# Patient Record
Sex: Female | Born: 1975 | Race: White | Hispanic: No | Marital: Married | Smoking: Current every day smoker
Health system: Southern US, Community
[De-identification: ages and names within clinical notes are randomized; demographics above are authoritative.]

## PROBLEM LIST (undated history)

## (undated) DIAGNOSIS — F419 Anxiety disorder, unspecified: Secondary | ICD-10-CM

## (undated) DIAGNOSIS — J45909 Unspecified asthma, uncomplicated: Secondary | ICD-10-CM

## (undated) DIAGNOSIS — K2970 Gastritis, unspecified, without bleeding: Secondary | ICD-10-CM

## (undated) DIAGNOSIS — I2510 Atherosclerotic heart disease of native coronary artery without angina pectoris: Secondary | ICD-10-CM

## (undated) DIAGNOSIS — M549 Dorsalgia, unspecified: Secondary | ICD-10-CM

## (undated) HISTORY — PX: IUD REMOVAL: SHX5392

---

## 1998-11-21 DIAGNOSIS — O321XX0 Maternal care for breech presentation, not applicable or unspecified: Secondary | ICD-10-CM

## 2001-08-01 DIAGNOSIS — O365990 Maternal care for other known or suspected poor fetal growth, unspecified trimester, not applicable or unspecified: Secondary | ICD-10-CM

## 2004-09-04 ENCOUNTER — Ambulatory Visit (HOSPITAL_COMMUNITY): Admission: AD | Admit: 2004-09-04 | Discharge: 2004-09-04 | Payer: Self-pay | Admitting: *Deleted

## 2010-07-27 ENCOUNTER — Emergency Department (HOSPITAL_COMMUNITY)
Admission: EM | Admit: 2010-07-27 | Discharge: 2010-07-27 | Disposition: A | Discharge status: Home / Self Care | Payer: Self-pay | Attending: Emergency Medicine | Admitting: Emergency Medicine

## 2010-07-27 DIAGNOSIS — W1809XA Striking against other object with subsequent fall, initial encounter: Secondary | ICD-10-CM | POA: Insufficient documentation

## 2010-07-27 DIAGNOSIS — R1031 Right lower quadrant pain: Secondary | ICD-10-CM | POA: Insufficient documentation

## 2010-07-27 DIAGNOSIS — M545 Low back pain, unspecified: Secondary | ICD-10-CM | POA: Insufficient documentation

## 2010-07-27 DIAGNOSIS — M79609 Pain in unspecified limb: Secondary | ICD-10-CM | POA: Insufficient documentation

## 2010-07-27 DIAGNOSIS — S20229A Contusion of unspecified back wall of thorax, initial encounter: Secondary | ICD-10-CM | POA: Insufficient documentation

## 2010-07-27 DIAGNOSIS — R319 Hematuria, unspecified: Secondary | ICD-10-CM | POA: Insufficient documentation

## 2010-07-27 LAB — URINE MICROSCOPIC-ADD ON

## 2010-07-27 LAB — URINALYSIS, ROUTINE W REFLEX MICROSCOPIC
Bilirubin Urine: NEGATIVE
Protein, ur: NEGATIVE mg/dL
Specific Gravity, Urine: 1.025 (ref 1.005–1.030)
Urobilinogen, UA: 0.2 mg/dL (ref 0.0–1.0)

## 2012-05-26 ENCOUNTER — Emergency Department (HOSPITAL_COMMUNITY)
Admission: EM | Admit: 2012-05-26 | Discharge: 2012-05-27 | Disposition: A | Discharge status: Home / Self Care | Payer: Self-pay | Attending: Emergency Medicine | Admitting: Emergency Medicine

## 2012-05-26 ENCOUNTER — Encounter (HOSPITAL_COMMUNITY): Payer: Self-pay

## 2012-05-26 DIAGNOSIS — S61409A Unspecified open wound of unspecified hand, initial encounter: Secondary | ICD-10-CM | POA: Insufficient documentation

## 2012-05-26 DIAGNOSIS — F411 Generalized anxiety disorder: Secondary | ICD-10-CM | POA: Insufficient documentation

## 2012-05-26 DIAGNOSIS — S61411A Laceration without foreign body of right hand, initial encounter: Secondary | ICD-10-CM

## 2012-05-26 DIAGNOSIS — F17200 Nicotine dependence, unspecified, uncomplicated: Secondary | ICD-10-CM | POA: Insufficient documentation

## 2012-05-26 DIAGNOSIS — IMO0002 Reserved for concepts with insufficient information to code with codable children: Secondary | ICD-10-CM | POA: Insufficient documentation

## 2012-05-26 DIAGNOSIS — Z79899 Other long term (current) drug therapy: Secondary | ICD-10-CM | POA: Insufficient documentation

## 2012-05-26 DIAGNOSIS — Z8739 Personal history of other diseases of the musculoskeletal system and connective tissue: Secondary | ICD-10-CM | POA: Insufficient documentation

## 2012-05-26 DIAGNOSIS — S0091XA Abrasion of unspecified part of head, initial encounter: Secondary | ICD-10-CM

## 2012-05-26 DIAGNOSIS — Z23 Encounter for immunization: Secondary | ICD-10-CM | POA: Insufficient documentation

## 2012-05-26 HISTORY — DX: Anxiety disorder, unspecified: F41.9

## 2012-05-26 HISTORY — DX: Dorsalgia, unspecified: M54.9
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
## 2012-05-27 ENCOUNTER — Emergency Department (HOSPITAL_COMMUNITY): Payer: Self-pay

## 2012-05-27 ENCOUNTER — Encounter (HOSPITAL_COMMUNITY): Payer: Self-pay

## 2012-05-27 MED ORDER — LIDOCAINE-EPINEPHRINE (PF) 2 %-1:200000 IJ SOLN
INTRAMUSCULAR | Status: AC
  Administered 2012-05-27: 02:00:00
  Filled 2012-05-27: qty 20

## 2012-05-27 MED ORDER — HYDROCODONE-ACETAMINOPHEN 5-325 MG PO TABS
1.0000 | ORAL_TABLET | Freq: Once | ORAL | Status: AC
  Administered 2012-05-27: 1 via ORAL

## 2012-05-27 MED ORDER — HYDROCODONE-ACETAMINOPHEN 5-325 MG PO TABS
ORAL_TABLET | ORAL | Status: AC
  Filled 2012-05-27: qty 1

## 2012-05-27 MED ORDER — HYDROCODONE-ACETAMINOPHEN 5-325 MG PO TABS: 1.0000 | ORAL_TABLET | ORAL | Status: DC | PRN

## 2012-05-27 MED ORDER — TETANUS-DIPHTHERIA TOXOIDS TD 5-2 LFU IM INJ
0.5000 mL | INJECTION | Freq: Once | INTRAMUSCULAR | Status: AC
  Administered 2012-05-27: 0.5 mL via INTRAMUSCULAR
  Filled 2012-05-27: qty 0.5

## 2012-05-27 MED ORDER — OXYCODONE-ACETAMINOPHEN 5-325 MG PO TABS
2.0000 | ORAL_TABLET | Freq: Once | ORAL | Status: AC
  Administered 2012-05-27: 2 via ORAL
  Filled 2012-05-27: qty 2

## 2012-05-27 MED ORDER — SULFAMETHOXAZOLE-TMP DS 800-160 MG PO TABS
1.0000 | ORAL_TABLET | Freq: Once | ORAL | Status: AC
  Administered 2012-05-27: 1 via ORAL
  Filled 2012-05-27: qty 1

## 2012-05-27 MED ORDER — SULFAMETHOXAZOLE-TRIMETHOPRIM 800-160 MG PO TABS: 1.0000 | ORAL_TABLET | Freq: Two times a day (BID) | ORAL | Status: DC
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
## 2012-05-30 NOTE — ED Provider Notes (Signed)
Medical screening examination/treatment/procedure(s) were performed by non-physician practitioner and as supervising physician I was immediately available for consultation/collaboration.  Nesiah Jump de Lo, MD 05/30/12 0055 

## 2012-10-31 ENCOUNTER — Ambulatory Visit: Payer: Self-pay | Admitting: Family Medicine

## 2012-11-16 ENCOUNTER — Emergency Department: Payer: Self-pay | Admitting: Internal Medicine

## 2013-03-19 ENCOUNTER — Ambulatory Visit: Payer: Self-pay | Admitting: Gastroenterology

## 2013-03-22 LAB — PATHOLOGY REPORT

## 2013-09-06 ENCOUNTER — Ambulatory Visit
Admit: 2013-09-06 | Disposition: A | Discharge status: Home / Self Care | Payer: Self-pay | Attending: Internal Medicine | Admitting: Internal Medicine

## 2013-09-06 ENCOUNTER — Ambulatory Visit: Admit: 2013-09-06 | Disposition: A | Discharge status: Home / Self Care | Payer: Self-pay | Admitting: Internal Medicine

## 2013-11-05 ENCOUNTER — Emergency Department: Payer: Self-pay | Admitting: Emergency Medicine

## 2013-11-23 LAB — OB RESULTS CONSOLE GC/CHLAMYDIA
Chlamydia: NEGATIVE
Gonorrhea: NEGATIVE

## 2013-11-23 LAB — OB RESULTS CONSOLE ABO/RH: RH TYPE: POSITIVE

## 2013-11-23 LAB — OB RESULTS CONSOLE RUBELLA ANTIBODY, IGM: Rubella: NON-IMMUNE/NOT IMMUNE

## 2013-11-23 LAB — OB RESULTS CONSOLE HEPATITIS B SURFACE ANTIGEN: HEP B S AG: NEGATIVE

## 2013-11-23 LAB — OB RESULTS CONSOLE HIV ANTIBODY (ROUTINE TESTING): HIV: NONREACTIVE

## 2013-11-23 LAB — OB RESULTS CONSOLE ANTIBODY SCREEN: Antibody Screen: NEGATIVE

## 2013-11-23 LAB — OB RESULTS CONSOLE RPR: RPR: NONREACTIVE

## 2013-12-10 ENCOUNTER — Encounter: Payer: Self-pay | Admitting: Maternal & Fetal Medicine

## 2014-01-28 ENCOUNTER — Encounter: Payer: Self-pay | Admitting: Obstetrics and Gynecology

## 2014-02-07 ENCOUNTER — Encounter: Payer: Self-pay | Admitting: Maternal & Fetal Medicine

## 2014-02-07 ENCOUNTER — Observation Stay: Payer: Self-pay

## 2014-02-15 NOTE — L&D Delivery Note (Signed)
Delivery Note  39 y.o. [phone] female at 5877w3d by 1st trimester u/s, who was admitted on 02/17/14 as [facility] for preterm labor and need for level 3 NICU. She was noted to be 3 cm dilated with BBOW and kept in Trendelenberg position; was on various tocolytics and received betamethasone regimen. On 02/19/14, she had significant pelvic pressure, noted to be fully dilated and continued to contract despite tocolysis. PPROM occurred around 1208 on 02/19/14, and she was started on latency antibiotics.  Pain worsened and she received epidural during the day. She was stable until around 3:45 am on 02/20/14 when she started to feel pelvic pressure and had copious LOF, and was noted to fully dilated with fetus at 3+ position.  Neonatology team notified, and they arrived in room and patient started to push  At 4:07 AM a viable female was delivered via Spontaneous Vaginal Delivery/Vaginal Delivery after Cesarean Section (VBAC after three previous cesarean sections).  APGAR: 3, 5,7; weight 1 lb 9.8 oz (731 g).  Delivery performed by Dr. Fredirick LatheKristy Acosta under my supervision. Infant immediately handed over to Neonatology team.  Placenta noted to be fundal after delivery. Uterine massage administered, also injected solution of IV Pitocin 20 units + 20 ml of LR into umbilical cord.  Placenta delivered around 4:58 AM, intact. Cord: 3 vessels with no complications. Cord pH: pending at the time of this notation.  Anesthesia: Epidural  Lacerations: None  Est. Blood Loss (mL):  200 ml  Mom to postpartum.  Baby to NICU.  Jaynie CollinsUGONNA  Jamarria Real, MD, FACOG Attending Obstetrician & Gynecologist Faculty Practice, [facility] [place]

## 2014-02-16 ENCOUNTER — Observation Stay: Payer: Self-pay | Admitting: Obstetrics & Gynecology

## 2014-02-16 ENCOUNTER — Inpatient Hospital Stay (HOSPITAL_COMMUNITY)
Admission: AD | Admit: 2014-02-16 | Discharge: 2014-02-22 | DRG: 775 | Disposition: A | Discharge status: Home / Self Care | Payer: BLUE CROSS/BLUE SHIELD | Source: Ambulatory Visit | Attending: Obstetrics & Gynecology | Admitting: Obstetrics & Gynecology

## 2014-02-16 DIAGNOSIS — Z91013 Allergy to seafood: Secondary | ICD-10-CM | POA: Diagnosis not present

## 2014-02-16 DIAGNOSIS — Z2839 Other underimmunization status: Secondary | ICD-10-CM

## 2014-02-16 DIAGNOSIS — Z720 Tobacco use: Secondary | ICD-10-CM | POA: Diagnosis not present

## 2014-02-16 DIAGNOSIS — Z882 Allergy status to sulfonamides status: Secondary | ICD-10-CM | POA: Diagnosis not present

## 2014-02-16 DIAGNOSIS — O3421 Maternal care for scar from previous cesarean delivery: Secondary | ICD-10-CM | POA: Diagnosis present

## 2014-02-16 DIAGNOSIS — F419 Anxiety disorder, unspecified: Secondary | ICD-10-CM | POA: Diagnosis present

## 2014-02-16 DIAGNOSIS — Z880 Allergy status to penicillin: Secondary | ICD-10-CM

## 2014-02-16 DIAGNOSIS — Z9889 Other specified postprocedural states: Secondary | ICD-10-CM

## 2014-02-16 DIAGNOSIS — O34219 Maternal care for unspecified type scar from previous cesarean delivery: Secondary | ICD-10-CM

## 2014-02-16 DIAGNOSIS — O99824 Streptococcus B carrier state complicating childbirth: Secondary | ICD-10-CM | POA: Diagnosis present

## 2014-02-16 DIAGNOSIS — Z283 Underimmunization status: Secondary | ICD-10-CM

## 2014-02-16 DIAGNOSIS — O9989 Other specified diseases and conditions complicating pregnancy, childbirth and the puerperium: Secondary | ICD-10-CM

## 2014-02-16 DIAGNOSIS — O26872 Cervical shortening, second trimester: Secondary | ICD-10-CM | POA: Diagnosis not present

## 2014-02-16 DIAGNOSIS — Z91012 Allergy to eggs: Secondary | ICD-10-CM

## 2014-02-16 DIAGNOSIS — Z3A24 24 weeks gestation of pregnancy: Secondary | ICD-10-CM | POA: Diagnosis present

## 2014-02-16 DIAGNOSIS — F17200 Nicotine dependence, unspecified, uncomplicated: Secondary | ICD-10-CM

## 2014-02-16 DIAGNOSIS — O09529 Supervision of elderly multigravida, unspecified trimester: Secondary | ICD-10-CM

## 2014-02-16 DIAGNOSIS — F1120 Opioid dependence, uncomplicated: Secondary | ICD-10-CM | POA: Diagnosis not present

## 2014-02-16 DIAGNOSIS — Z789 Other specified health status: Secondary | ICD-10-CM | POA: Diagnosis not present

## 2014-02-16 DIAGNOSIS — G8929 Other chronic pain: Secondary | ICD-10-CM | POA: Diagnosis present

## 2014-02-16 DIAGNOSIS — O09522 Supervision of elderly multigravida, second trimester: Secondary | ICD-10-CM

## 2014-02-16 DIAGNOSIS — O09899 Supervision of other high risk pregnancies, unspecified trimester: Secondary | ICD-10-CM

## 2014-02-16 DIAGNOSIS — O99323 Drug use complicating pregnancy, third trimester: Secondary | ICD-10-CM | POA: Diagnosis not present

## 2014-02-16 DIAGNOSIS — Z8619 Personal history of other infectious and parasitic diseases: Secondary | ICD-10-CM

## 2014-02-16 DIAGNOSIS — O26879 Cervical shortening, unspecified trimester: Secondary | ICD-10-CM | POA: Insufficient documentation

## 2014-02-16 HISTORY — DX: Gastritis, unspecified, without bleeding: K29.70

## 2014-02-16 LAB — DRUG SCREEN, URINE
Amphetamines, Ur Screen: NEGATIVE (ref ?–1000)
Barbiturates, Ur Screen: NEGATIVE (ref ?–200)
Benzodiazepine, Ur Scrn: NEGATIVE (ref ?–200)
CANNABINOID 50 NG, UR ~~LOC~~: NEGATIVE (ref ?–50)
COCAINE METABOLITE, UR ~~LOC~~: NEGATIVE (ref ?–300)
MDMA (Ecstasy)Ur Screen: NEGATIVE (ref ?–500)
Methadone, Ur Screen: POSITIVE (ref ?–300)
Opiate, Ur Screen: POSITIVE (ref ?–300)
PHENCYCLIDINE (PCP) UR S: NEGATIVE (ref ?–25)
Tricyclic, Ur Screen: NEGATIVE (ref ?–1000)

## 2014-02-16 LAB — URINALYSIS, COMPLETE
BACTERIA: NONE SEEN
BACTERIA: NONE SEEN
BILIRUBIN, UR: NEGATIVE
Bilirubin,UR: NEGATIVE
GLUCOSE, UR: NEGATIVE mg/dL (ref 0–75)
Glucose,UR: NEGATIVE mg/dL (ref 0–75)
KETONE: NEGATIVE
KETONE: NEGATIVE
LEUKOCYTE ESTERASE: NEGATIVE
Leukocyte Esterase: NEGATIVE
Nitrite: NEGATIVE
Nitrite: NEGATIVE
Ph: 6 (ref 4.5–8.0)
Ph: 5 (ref 4.5–8.0)
Protein: NEGATIVE
Protein: NEGATIVE
Specific Gravity: 1.002 (ref 1.003–1.030)
Specific Gravity: 1.012 (ref 1.003–1.030)
Squamous Epithelial: 2
WBC UR: NONE SEEN /HPF (ref 0–5)

## 2014-02-16 LAB — OB RESULTS CONSOLE GBS: GBS: NEGATIVE

## 2014-02-17 ENCOUNTER — Encounter (HOSPITAL_COMMUNITY): Payer: Self-pay | Admitting: *Deleted

## 2014-02-17 ENCOUNTER — Inpatient Hospital Stay (HOSPITAL_COMMUNITY): Payer: BLUE CROSS/BLUE SHIELD

## 2014-02-17 DIAGNOSIS — Z283 Underimmunization status: Secondary | ICD-10-CM

## 2014-02-17 DIAGNOSIS — Z3A24 24 weeks gestation of pregnancy: Secondary | ICD-10-CM | POA: Insufficient documentation

## 2014-02-17 DIAGNOSIS — O09529 Supervision of elderly multigravida, unspecified trimester: Secondary | ICD-10-CM

## 2014-02-17 DIAGNOSIS — Z8619 Personal history of other infectious and parasitic diseases: Secondary | ICD-10-CM

## 2014-02-17 DIAGNOSIS — O26879 Cervical shortening, unspecified trimester: Secondary | ICD-10-CM

## 2014-02-17 DIAGNOSIS — Z9889 Other specified postprocedural states: Secondary | ICD-10-CM

## 2014-02-17 DIAGNOSIS — O34219 Maternal care for unspecified type scar from previous cesarean delivery: Secondary | ICD-10-CM

## 2014-02-17 DIAGNOSIS — Z2839 Other underimmunization status: Secondary | ICD-10-CM

## 2014-02-17 DIAGNOSIS — O9989 Other specified diseases and conditions complicating pregnancy, childbirth and the puerperium: Secondary | ICD-10-CM

## 2014-02-17 DIAGNOSIS — F17200 Nicotine dependence, unspecified, uncomplicated: Secondary | ICD-10-CM

## 2014-02-17 DIAGNOSIS — F1120 Opioid dependence, uncomplicated: Secondary | ICD-10-CM

## 2014-02-17 LAB — CBC
HCT: 28.6 % — ABNORMAL LOW (ref 36.0–46.0)
Hemoglobin: 9.9 g/dL — ABNORMAL LOW (ref 12.0–15.0)
MCH: 31.5 pg (ref 26.0–34.0)
MCHC: 34.6 g/dL (ref 30.0–36.0)
MCV: 91.1 fL (ref 78.0–100.0)
Platelets: 292 10*3/uL (ref 150–400)
RBC: 3.14 MIL/uL — ABNORMAL LOW (ref 3.87–5.11)
RDW: 12.5 % (ref 11.5–15.5)
WBC: 24.3 10*3/uL — ABNORMAL HIGH (ref 4.0–10.5)

## 2014-02-17 LAB — HIV ANTIBODY (ROUTINE TESTING W REFLEX): HIV: NONREACTIVE

## 2014-02-17 LAB — AMNISURE RUPTURE OF MEMBRANE (ROM) NOT AT ARMC: Amnisure ROM: NEGATIVE

## 2014-02-17 LAB — GC/CHLAMYDIA PROBE AMP

## 2014-02-17 LAB — TYPE AND SCREEN
ABO/RH(D): O POS
Antibody Screen: NEGATIVE

## 2014-02-17 LAB — ABO/RH: ABO/RH(D): O POS

## 2014-02-17 LAB — RPR

## 2014-02-17 MED ORDER — DOCUSATE SODIUM 100 MG PO CAPS
100.0000 mg | ORAL_CAPSULE | Freq: Two times a day (BID) | ORAL | Status: DC
  Administered 2014-02-17 – 2014-02-19 (×4): 100 mg via ORAL
  Filled 2014-02-17 (×5): qty 1

## 2014-02-17 MED ORDER — METHADONE HCL 10 MG PO TABS
10.0000 mg | ORAL_TABLET | Freq: Three times a day (TID) | ORAL | Status: DC
Start: 2014-02-17 — End: 2014-02-22
  Administered 2014-02-17 – 2014-02-22 (×16): 10 mg via ORAL
  Filled 2014-02-17 (×16): qty 1

## 2014-02-17 MED ORDER — PRENATAL MULTIVITAMIN CH
1.0000 | ORAL_TABLET | Freq: Every day | ORAL | Status: DC
  Administered 2014-02-17: 1 via ORAL
  Filled 2014-02-17: qty 1

## 2014-02-17 MED ORDER — MAGNESIUM SULFATE 40 G IN LACTATED RINGERS - SIMPLE
2.5000 g/h | INTRAVENOUS | Status: DC
  Administered 2014-02-17 – 2014-02-18 (×3): 2.5 g/h via INTRAVENOUS
  Filled 2014-02-17 (×3): qty 500

## 2014-02-17 MED ORDER — METHADONE HCL 10 MG PO TABS
20.0000 mg | ORAL_TABLET | Freq: Every day | ORAL | Status: DC
Start: 2014-02-17 — End: 2014-02-22
  Administered 2014-02-17 – 2014-02-21 (×5): 20 mg via ORAL
  Filled 2014-02-17 (×5): qty 2

## 2014-02-17 MED ORDER — ACYCLOVIR 200 MG PO CAPS
400.0000 mg | ORAL_CAPSULE | Freq: Three times a day (TID) | ORAL | Status: DC
  Administered 2014-02-17 (×3): 400 mg via ORAL
  Filled 2014-02-17 (×7): qty 2

## 2014-02-17 MED ORDER — CALCIUM CARBONATE ANTACID 500 MG PO CHEW: 2.0000 | CHEWABLE_TABLET | ORAL | Status: DC | PRN

## 2014-02-17 MED ORDER — ACETAMINOPHEN 325 MG PO TABS: 650.0000 mg | ORAL_TABLET | ORAL | Status: DC | PRN

## 2014-02-17 MED ORDER — VANCOMYCIN HCL IN DEXTROSE 1-5 GM/200ML-% IV SOLN
1000.0000 mg | Freq: Two times a day (BID) | INTRAVENOUS | Status: DC
  Administered 2014-02-17 – 2014-02-18 (×3): 1000 mg via INTRAVENOUS
  Filled 2014-02-17 (×4): qty 200

## 2014-02-17 MED ORDER — BETAMETHASONE SOD PHOS & ACET 6 (3-3) MG/ML IJ SUSP
6.0000 mg | Freq: Once | INTRAMUSCULAR | Status: AC
  Administered 2014-02-17: 6 mg via INTRAMUSCULAR
  Filled 2014-02-17: qty 1

## 2014-02-17 MED ORDER — ZOLPIDEM TARTRATE 5 MG PO TABS: 5.0000 mg | ORAL_TABLET | Freq: Every evening | ORAL | Status: DC | PRN

## 2014-02-17 MED ORDER — ZOLPIDEM TARTRATE 5 MG PO TABS
5.0000 mg | ORAL_TABLET | Freq: Every evening | ORAL | Status: DC | PRN
  Administered 2014-02-17 – 2014-02-19 (×2): 5 mg via ORAL
  Filled 2014-02-17 (×2): qty 1

## 2014-02-17 MED ORDER — LACTATED RINGERS IV SOLN
INTRAVENOUS | Status: DC
  Administered 2014-02-16 – 2014-02-19 (×7): via INTRAVENOUS

## 2014-02-17 MED ORDER — DOCUSATE SODIUM 100 MG PO CAPS: 100.0000 mg | ORAL_CAPSULE | Freq: Every day | ORAL | Status: DC

## 2014-02-17 MED ORDER — BETAMETHASONE SOD PHOS & ACET 6 (3-3) MG/ML IJ SUSP
12.0000 mg | Freq: Once | INTRAMUSCULAR | Status: AC
  Administered 2014-02-17: 12 mg via INTRAMUSCULAR
  Filled 2014-02-17: qty 2

## 2014-02-17 MED ORDER — PROGESTERONE MICRONIZED 200 MG PO CAPS
200.0000 mg | ORAL_CAPSULE | Freq: Every day | ORAL | Status: DC
  Administered 2014-02-17 – 2014-02-18 (×3): 200 mg via VAGINAL
  Filled 2014-02-17 (×4): qty 1

## 2014-02-17 NOTE — Plan of Care (Signed)
Problem: Consults Goal: Birthing Suites Patient Information Press F2 to bring up selections list Outcome: Completed/Met Date Met:  02/17/14  Pt < [redacted] weeks EGA

## 2014-02-17 NOTE — H&P (Signed)
Stacey Hoffman is a 39 y.o. [phone] female at [redacted]w[redacted]d by 1st trimester u/s,  presenting as transfer from [place] for preterm labor and need for level 3 NICU. Known short cx of 1.9cm w/ funneling since anatomy scan, was placed on nightly prometrium. Reported uc's/pressure today, was 3cm w/ membranes visible but not protruding through os.  GBS, GC/CT, UA, UDS were collected. UDS + for opiates and methadone. UA neg. Was given mag 4gm loading dose, then 1gm/hr which was subsequently increased to 2 then to 2.5 d/t uc's. BMZ  IM @ 2030. Vanc 1gm IV @ 2200 for gbs prophylaxis and PCN allergy. Indocin  loading dose @ 2205. Had informal bs u/s which revealed vtx presentation, normal fluid, fundal placenta.  Reports active fetal movement, contractions: regular- much better than they were earlier today, vaginal bleeding: none, membranes: intact. Prenatal care at [facility] in [place].   Pregnancy complicated by prior c/s x3, chronic opioid use d/t chronic hip pain- goes to [facility] in [place] and sees Dr. Osborne Oman- current regimen is Oxycodone IR  TID and was recently started on methadone on 12/3 and regimen is  TID and  q hs. 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 Smoker 1.5ppd, s/p LEEP 1998, short cervix, rub non-imm, AMA w/ neg NIPT XY  1999: 21wk vaginal PTB twins d/t placental abruption 2000: Term c/s 2000 d/t breech 2003: 36wk c/s for IUGR  2006: term c/s   Past Medical History: Past Medical History  Diagnosis Date  . Anxiety   . Back pain   . Gastritis     Past Surgical History: Past Surgical History  Procedure Laterality Date  . Cesarean section    . Iud removal N/A     IUD imbeded into uterus    Obstetrical History: OB History    Gravida Para Term Preterm AB TAB SAB Ectopic Multiple Living   0 0 0 0 1 3      Social History: History   Social History  . Marital Status: Married    Spouse Name: N/A    Number of Children: N/A  .  Years of Education: N/A   Social History Main Topics  . Smoking status: Current Every Day Smoker -- 1.50 packs/day for 15 years    Types: Cigarettes  . Smokeless tobacco: None  . Alcohol Use: No  . Drug Use: Yes    Special: Oxycodone, Other-see comments  . Sexual Activity: None   Other Topics Concern  . None   Social History Narrative    Family History: History reviewed. No pertinent family history.  Allergies: Allergies  Allergen Reactions  . Eggs Or Egg-Derived Products Hives  . Penicillins   . Shellfish Allergy Hives  . Sulfa Antibiotics Swelling    Prescriptions prior to admission  Medication Sig Dispense Refill Last Dose  . diazepam (VALIUM) 5 MG tablet Take 5 mg by mouth every 6 (six) hours as needed for anxiety.     Marland Kitchen HYDROcodone-acetaminophen (NORCO) 10-325 MG per tablet Take 1 tablet by mouth every 6 (six) hours as needed for pain.     Marland Kitchen HYDROcodone-acetaminophen (NORCO/VICODIN) 5-325 MG per tablet Take 1 tablet by mouth every 4 (four) hours as needed for pain. 15 tablet 0   . phentermine (ADIPEX-P) 37.5 MG tablet Take 37.5 mg by mouth daily before breakfast.     . sulfamethoxazole-trimethoprim (SEPTRA DS) 800-160 MG per tablet Take 1 tablet by mouth every 12 (twelve) hours.  20 tablet 0      Review of Systems  Pertinent pos/neg as indicated in HPI    Blood pressure 106/57, pulse 70, temperature 97.3 F (36.3 C), temperature source Oral, resp. rate 18, height  (1.499 m), weight 66.679 kg (147 lb). General appearance: alert, cooperative and no distress Lungs: clear to auscultation bilaterally Heart: regular rate and rhythm Abdomen: gravid, soft, non-tender Extremities: no edema DTR's 2+  Fetal monitoring: FHR: 135 bpm, variability: minimal ,  Accelerations: Abscent,  decelerations:  Absent Uterine activity: none tracing, pt reporting uc's spacing out and much better than earlier today    Presentation: cephalic by informal bs u/s at  [facility]   Prenatal labs: ABO, Rh: O/Positive/-- (10/09 0000) Antibody: Negative (10/09 0000) Rubella:  non-imm RPR: Nonreactive (10/09 0000)  HBsAg: Negative (10/09 0000)  HIV: Non-reactive (10/09 0000)  GBS:   pending  1 hr Glucola: not done yet Genetic screening:  NIPT neg, XY Anatomy US: normal fetus w/ CL 1.9 w/ funneling  No results found for this or any previous visit (from the past 24 hour(s)).   Assessment:  [redacted]w[redacted]d SIUP  [id number]  PTL  H/O c/s x 3  Opioid dependence  Short cervix  H/O HSV no active lesions  Cat 2 FHR, minimal variability, 24wks on mag  GBS  pending  Smoker  AMA  Rub non-imm  H/O LEEP  Plan:  Discussed all w/ Dr. 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 Jolayne Panther- recommended having pharmacy confirm methadone regimen- and not to order methadone and oxycodone together.   Spoke w/ Ann, pharmacist- who states she can not confirm methadone tonight, may be able to in am. She recommends ordering sustained release oxycodone until we are able to confirm meds/dosages.     Marge Duncans CNM, [facility] 02/17/2014, 1:04 AM    0245: pt's fob brought in rx bottles from car (1) methadone  TID,  q hs (2) oxycodone hcl  TID prn pain Discussed w/ Ann in pharmacy, who states ok to order both as rx'd, but per previous discussion w/ Dr. Jolayne Panther she does not want her on both here- so will just order methadone as rx'd.  Cheral Marker, CNM, [facility] 02/17/2014 2:47 AM

## 2014-02-17 NOTE — Progress Notes (Addendum)
Patient ID: Stacey Hoffman, female   DOB: Jun 24, 1975, 39 y.o.   MRN: [id number] FACULTY PRACTICE ANTEPARTUM(COMPREHENSIVE) NOTE  Stacey Hoffman is a 39 y.o. [id number] at [redacted]w[redacted]d who is admitted for Preterm labor.   Fetal presentation is cephalic. Length of Stay:  1  Days  Subjective: Doing well, uc's have stopped, feeling a lot of pressure like she needs to have a bm Patient reports the fetal movement as active. Patient reports uterine contraction  activity as none. Patient reports  vaginal bleeding as none. Patient describes fluid per vagina as None.  Vitals:  Blood pressure 88/45, pulse 75, temperature 97.6 F (36.4 C), temperature source Oral, resp. rate 18, height  (1.499 m), weight 66.679 kg (147 lb), last menstrual period 07/29/2013. Physical Examination:  General appearance - alert, well appearing, and in no distress Heart - normal rate and regular rhythm Abdomen - soft, nontender, nondistended Fundal Height:  size equals dates Cervical Exam: 3/80/ballotable, membranes intact  Extremities: extremities normal, atraumatic, no cyanosis or edema and Homans sign is negative, no sign of DVT with DTRs 2+ bilaterally Membranes:intact  Fetal Monitoring:  Baseline: 140 bpm, Variability: min-mod, Accelerations: 10x10 and Decelerations: rare variable  Labs:  Results for orders placed or performed during the hospital encounter of 02/16/14 (from the past 24 hour(s))  CBC on admission   Collection Time: 02/17/14  1:20 AM  Result Value Ref Range   WBC 24.3 (H) 4.0 - 10.5 K/uL   RBC 3.14 (L) 3.87 - 5.11 MIL/uL   Hemoglobin 9.9 (L) 12.0 - 15.0 g/dL   HCT 14.7 (L) 82.9 - 56.2 %   MCV 91.1 78.0 - 100.0 fL   MCH 31.5 26.0 - 34.0 pg   MCHC 34.6 30.0 - 36.0 g/dL   RDW 13.0 86.5 - 78.4 %   Platelets 292 150 - 400 K/uL    Imaging Studies:    Limited 02/17/14: vtx, afv normal, cervical funneling  Medications:  Scheduled . acyclovir  400 mg Oral TID  . betamethasone  acetate-betamethasone sodium phosphate  12 mg Intramuscular Once  . docusate sodium  100 mg Oral BID  . methadone  10 mg Oral TID  . methadone  20 mg Oral QHS  . prenatal multivitamin  1 tablet Oral Q1200  . progesterone  200 mg Vaginal QHS  . vancomycin  1,000 mg Intravenous Q12H   I have reviewed the patient's current medications.  ASSESSMENT: Patient Active Problem List   Diagnosis Date Noted  . Preterm labor 02/17/2014  . Previous cesarean delivery x 3, antepartum 02/17/2014  . Opioid dependence 02/17/2014  . History of herpes genitalis 02/17/2014  . Smoker 02/17/2014  . Short cervix affecting pregnancy 02/17/2014  . Rubella non-immune status, antepartum 02/17/2014  . AMA (advanced maternal age) multigravida 35+ 02/17/2014  . H/O LEEP 02/17/2014    PLAN: Continue mag until 2nd BMZ tonight @ 2030 Continue vanc until gbs returns Continue prometrium Methadone  TID and  q hs Acyclovir for HSV suppression NICU consult Prev c/s x 3   Namish Krise, Cheron Every 02/17/2014,7:45 AM

## 2014-02-18 ENCOUNTER — Inpatient Hospital Stay (HOSPITAL_COMMUNITY): Payer: BLUE CROSS/BLUE SHIELD

## 2014-02-18 ENCOUNTER — Encounter (HOSPITAL_COMMUNITY): Payer: Self-pay | Admitting: *Deleted

## 2014-02-18 DIAGNOSIS — O26879 Cervical shortening, unspecified trimester: Secondary | ICD-10-CM | POA: Insufficient documentation

## 2014-02-18 LAB — GROUP B STREP BY PCR: Group B strep by PCR: NEGATIVE

## 2014-02-18 LAB — AMNISURE RUPTURE OF MEMBRANE (ROM) NOT AT ARMC: Amnisure ROM: NEGATIVE

## 2014-02-18 MED ORDER — ACYCLOVIR 400 MG PO TABS
400.0000 mg | ORAL_TABLET | Freq: Three times a day (TID) | ORAL | Status: DC
  Administered 2014-02-18 – 2014-02-19 (×4): 400 mg via ORAL
  Filled 2014-02-18 (×6): qty 1

## 2014-02-18 MED ORDER — NIFEDIPINE ER 30 MG PO TB24
30.0000 mg | ORAL_TABLET | Freq: Two times a day (BID) | ORAL | Status: DC
Start: 2014-02-18 — End: 2014-02-19
  Administered 2014-02-18 (×2): 30 mg via ORAL
  Filled 2014-02-18 (×4): qty 1

## 2014-02-18 MED ORDER — BETAMETHASONE SOD PHOS & ACET 6 (3-3) MG/ML IJ SUSP
12.0000 mg | Freq: Once | INTRAMUSCULAR | Status: DC
  Filled 2014-02-18: qty 2

## 2014-02-18 MED ORDER — NIFEDIPINE 10 MG PO CAPS
10.0000 mg | ORAL_CAPSULE | ORAL | Status: DC | PRN
  Administered 2014-02-18 – 2014-02-19 (×2): 10 mg via ORAL
  Filled 2014-02-18 (×2): qty 1

## 2014-02-18 NOTE — Progress Notes (Signed)
Pt called to report that she thought her water broke. Towel wet but smells of urine, foley cath in place. Dr Adrian Blackwater notified, plan for [facility]. Test performed and sent to lab at 2145. Pt resting comfortably in bed but visibly upset about possible SROM.

## 2014-02-18 NOTE — Progress Notes (Signed)
I spent time with Stacey Hoffman and her friend this afternoon.  They both have a strong faith and are holding out hope that Stacey Hoffman's pregnancy will continue for awhile.  They are counting every hour and day that she remains pregnant as a blessing.  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  Stacey Hoffman has significant worry about leaving so much to her husband to cope with alone, but even during our conversation, she received a phone call from her father-in-law that he would help take care of the children.    We will continue to follow up for support, but please also page as needs arise.  [employer] Pager, [phone] 4:30 PM    02/18/14 1600  Clinical Encounter Type  Visited With Patient  Visit Type Spiritual support  Referral From Nurse  Spiritual Encounters  Spiritual Needs Emotional  Stress Factors  Patient Stress Factors Loss of control

## 2014-02-18 NOTE — Progress Notes (Signed)
Pt told she was now NPO. Became very angry. States no MD has seen her since she has been here. Pt states " I have only seen people like you". States no one will help her wants her bed rail down to reach her stuff. Offered to put it down pt slamming stuff around and states now there is no need since she can't have anything. Advised pt I would be willing to help her all she has to do is ask. Pt now refusing to respond to anything I say.

## 2014-02-18 NOTE — Progress Notes (Signed)
Patient ID: Stacey Hoffman, female   DOB: [birth year], 39 y.o.   MRN: [id number]  Called to patient room - having contraction about every 3 minutes.  Patient was transitioned to Procardia XL  at 3pm.  Will give Procardia IR  q4 hr prn.  If this is not helpful at eliminating contractions, will restart magnesium sulfate.   Also, consented pt for repeat cesarean section, should patient need it emergently - The risks of cesarean section discussed with the patient included but were not limited to: bleeding which may require transfusion or reoperation; infection which may require antibiotics; injury to bowel, bladder, ureters or other surrounding organs; injury to the fetus;  incisional problems, thromboembolic phenomenon and other postoperative/anesthesia complications. The patient concurred with the proposed plan, giving informed written consent for the procedure.     Candelaria Celeste JEHIEL 02/18/2014 8:49 PM

## 2014-02-18 NOTE — Progress Notes (Signed)
Pr req I take 1200 VS now and would like to rest uninterrupted for the next several hours. Instructed pt to call if she needs me. Pt voices understanding.

## 2014-02-18 NOTE — Progress Notes (Signed)
S: Patient asking to speak to MD about confirmation of plan since receiving [place]. Spoke to Dr. Jolayne Panther and reviewed [place] report of "open cervix." Given improving frequency of contractions on magnesium, feel stable to wean off magnesium and initiate procardia 30 mg XL BID.   Pt does report back pain (chronic) and intermittent pressure/burning in pelvis that causes her to want to "squeeze her legs."  O: Deferred any physical exam.  [place]: Cervic appears open, infant cephalic  A/P: PTL with advance cervical dilation.  1) Wean magnesium and initiate procardia 30 mg XL BID 2) Obtain GBS records from [facility]. If neg, can d/c vanc. Also obtain GBS, although less accurate in setting of tx with Vanc. 3) Obtain US for EFW per NICU request.   Plan discussed with Dr. Jolayne Panther, OB attending.   Addendum -  GBS report from [facility]  - NG at 36 hours, will d/c Vanc.

## 2014-02-18 NOTE — Progress Notes (Signed)
Patient ID: Stacey Hoffman, female   DOB: 06-27-1975, 39 y.o.   MRN: [id number] ACULTY PRACTICE ANTEPARTUM COMPREHENSIVE PROGRESS NOTE  Stacey Hoffman is a 39 y.o. [id number] at [redacted]w[redacted]d  who is admitted for Preterm labor.   Fetal presentation is cephalic. Length of Stay:  2  Days Past Surgical History  Procedure Laterality Date  . Cesarean section    . Iud removal N/A     IUD imbeded into uterus    Subjective: Pt reports increased pressure in the vagina.  She also reports some leakage but, she is not sure whether it is urine.  The amnisure on yesterday was negative.  Patient reports good fetal movement.  She reports no uterine contractions and no bleeding.  Vitals:  Blood pressure 96/57, pulse 77, temperature 98.3 F (36.8 C), temperature source Oral, resp. rate 16, height  (1.499 m), weight 147 lb (66.679 kg), last menstrual period 07/29/2013, SpO2 99 %. Physical Examination: General appearance - alert, well appearing, and in no distress Abdomen - soft, nontender, nondistended, no masses or organomegaly gravid Pelvic - there appears to be a large bulging BOW.  There appears to be cervix around it but, due to concern of rupturing the membranes I aborted the exam.  Extremities - no pedal edema noted Extremities: extremities normal, atraumatic, no cyanosis or edema  Membranes:intact  Fetal Monitoring:  Baseline: 140's bpm, Variability: Fair (1-6 bpm), Accelerations: Non-reactive but appropriate for gestational age and Decelerations: Variable: mild Toco: no ctx Labs:  Results for orders placed or performed during the hospital encounter of 02/16/14 (from the past 24 hour(s))  Amnisure rupture of membrane (rom)   Collection Time: 02/17/14  4:15 PM  Result Value Ref Range   Amnisure ROM NEGATIVE     Imaging Studies:    See below  Medications:  Scheduled . acyclovir  400 mg Oral TID  . docusate sodium  100 mg Oral BID  . methadone  10 mg Oral TID  . methadone  20 mg Oral  QHS  . prenatal multivitamin  1 tablet Oral Q1200  . progesterone  200 mg Vaginal QHS  . vancomycin  1,000 mg Intravenous Q12H   I have reviewed the patient's current medications.  ASSESSMENT: Patient Active Problem List   Diagnosis Date Noted  . Preterm labor 02/17/2014  . Previous cesarean delivery x 3, antepartum 02/17/2014  . Opioid dependence 02/17/2014  . History of herpes genitalis 02/17/2014  . Smoker 02/17/2014  . Short cervix affecting pregnancy 02/17/2014  . Rubella non-immune status, antepartum 02/17/2014  . AMA (advanced maternal age) multigravida 35+ 02/17/2014  . H/O LEEP 02/17/2014  . [redacted] weeks gestation of pregnancy     PLAN: sono today to check cervical dilation and presnetation Keep Magnesium and vanc for now Continue routine antenatal care.   HARRAWAY-SMITH, Shacoya Burkhammer 02/18/2014,7:43 AM

## 2014-02-18 NOTE — Consult Note (Addendum)
Asked by Dr.Harraway-Smith to provide prenatal consultation for patient at risk for preterm delivery due to preterm labor.  Mother is 39 y.o. G5 P2-2-0-3 who is now 24 1/[redacted] weeks EGA, with pregnancy complicated by advanced cervical dilatation with bulging membranes.  She has been treated with betamethasone (2nd dose about 2030 on 10/3) and is getting MgSO4 for tocoloysis and fetal neuroprotection, and vancomycin (GBS pending).  Discussed usual expectations for preterm infant at 48 - [redacted] weeks gestation, including possible needs for DR resuscitation, respiratory support, IV access, and blood products.  Also presented risks of death or serious morbidity, including neurodevelopmental disability.  Projected possible length of stay in NICU until 36 - [redacted] wks EGA.  Discussed advantages of feeding with mother's milk.  She plans to pump postnatally.  Patient was attentive, had appropriate questions, and was appreciative of my input.  Thank you for the consultation.  Total time 25 minutes  Stacey Hoffman., MD

## 2014-02-18 NOTE — Progress Notes (Signed)
Attempted to notify Dr. Jolayne Panther of [place] result. No answer on attending phone. Dr. Loreta Ave notified.

## 2014-02-19 ENCOUNTER — Inpatient Hospital Stay (HOSPITAL_COMMUNITY): Payer: BLUE CROSS/BLUE SHIELD | Admitting: Anesthesiology

## 2014-02-19 DIAGNOSIS — F1120 Opioid dependence, uncomplicated: Secondary | ICD-10-CM

## 2014-02-19 DIAGNOSIS — O09522 Supervision of elderly multigravida, second trimester: Secondary | ICD-10-CM

## 2014-02-19 LAB — AMNISURE RUPTURE OF MEMBRANE (ROM) NOT AT ARMC: Amnisure ROM: NEGATIVE

## 2014-02-19 LAB — BETA STREP CULTURE(ARMC)

## 2014-02-19 MED ORDER — LACTATED RINGERS IV SOLN: 500.0000 mL | Freq: Once | INTRAVENOUS | Status: DC

## 2014-02-19 MED ORDER — GENTAMICIN IN SALINE 1-0.9 MG/ML-% IV SOLN
100.0000 mg | Freq: Two times a day (BID) | INTRAVENOUS | Status: DC
  Filled 2014-02-19: qty 100

## 2014-02-19 MED ORDER — EPHEDRINE 5 MG/ML INJ
10.0000 mg | INTRAVENOUS | Status: DC | PRN
  Filled 2014-02-19: qty 2

## 2014-02-19 MED ORDER — FENTANYL 2.5 MCG/ML BUPIVACAINE 1/10 % EPIDURAL INFUSION (WH - ANES)
14.0000 mL/h | INTRAMUSCULAR | Status: DC | PRN
  Administered 2014-02-19 – 2014-02-20 (×2): 14 mL/h via EPIDURAL
  Filled 2014-02-19 (×2): qty 125

## 2014-02-19 MED ORDER — PHENYLEPHRINE 40 MCG/ML (10ML) SYRINGE FOR IV PUSH (FOR BLOOD PRESSURE SUPPORT)
80.0000 ug | PREFILLED_SYRINGE | INTRAVENOUS | Status: DC | PRN
  Filled 2014-02-19: qty 2

## 2014-02-19 MED ORDER — CLINDAMYCIN PHOSPHATE 900 MG/50ML IV SOLN
900.0000 mg | Freq: Three times a day (TID) | INTRAVENOUS | Status: DC
  Administered 2014-02-19 (×2): 900 mg via INTRAVENOUS
  Filled 2014-02-19 (×4): qty 50

## 2014-02-19 MED ORDER — PHENYLEPHRINE 40 MCG/ML (10ML) SYRINGE FOR IV PUSH (FOR BLOOD PRESSURE SUPPORT)
80.0000 ug | PREFILLED_SYRINGE | INTRAVENOUS | Status: DC | PRN
  Filled 2014-02-19: qty 20
  Filled 2014-02-19: qty 2

## 2014-02-19 MED ORDER — AZITHROMYCIN 500 MG PO TABS
1000.0000 mg | ORAL_TABLET | Freq: Once | ORAL | Status: AC
  Administered 2014-02-19: 1000 mg via ORAL
  Filled 2014-02-19: qty 2

## 2014-02-19 MED ORDER — LIDOCAINE HCL (PF) 1 % IJ SOLN
INTRAMUSCULAR | Status: DC | PRN
  Administered 2014-02-19: 4 mL
  Administered 2014-02-19: 6 mL

## 2014-02-19 MED ORDER — DIPHENHYDRAMINE HCL 50 MG/ML IJ SOLN: 12.5000 mg | INTRAMUSCULAR | Status: DC | PRN

## 2014-02-19 MED ORDER — MAGNESIUM SULFATE 40 G IN LACTATED RINGERS - SIMPLE
2.0000 g/h | INTRAVENOUS | Status: DC
Start: 2014-02-19 — End: 2014-02-20
  Administered 2014-02-19: 2 g/h via INTRAVENOUS
  Filled 2014-02-19: qty 500

## 2014-02-19 MED ORDER — GENTAMICIN SULFATE 40 MG/ML IJ SOLN
100.0000 mg | Freq: Two times a day (BID) | INTRAVENOUS | Status: DC
  Administered 2014-02-19 – 2014-02-20 (×2): 100 mg via INTRAVENOUS
  Filled 2014-02-19 (×3): qty 2.5

## 2014-02-19 MED ORDER — FENTANYL CITRATE 0.05 MG/ML IJ SOLN
100.0000 ug | INTRAMUSCULAR | Status: DC | PRN
  Administered 2014-02-19 (×3): 100 ug via INTRAVENOUS
  Filled 2014-02-19 (×3): qty 2

## 2014-02-19 MED ORDER — MAGNESIUM SULFATE BOLUS VIA INFUSION
6.0000 g | Freq: Once | INTRAVENOUS | Status: AC
  Administered 2014-02-19: 6 g via INTRAVENOUS
  Filled 2014-02-19: qty 500

## 2014-02-19 MED ORDER — NICOTINE 14 MG/24HR TD PT24
14.0000 mg | MEDICATED_PATCH | Freq: Every day | TRANSDERMAL | Status: DC
  Administered 2014-02-19 – 2014-02-21 (×4): 14 mg via TRANSDERMAL
  Filled 2014-02-19 (×5): qty 1

## 2014-02-19 MED ORDER — NIFEDIPINE ER 60 MG PO TB24
60.0000 mg | ORAL_TABLET | Freq: Two times a day (BID) | ORAL | Status: DC
  Administered 2014-02-19: 60 mg via ORAL
  Filled 2014-02-19: qty 1

## 2014-02-19 NOTE — Anesthesia Preprocedure Evaluation (Signed)
Anesthesia Evaluation  Patient identified by MRN, date of birth, ID band Patient awake    Reviewed: Allergy & Precautions, H&P , Patient's Chart, lab work & pertinent test results  Airway Mallampati: II TM Distance: >3 FB Neck ROM: full    Dental  (+) Teeth Intact   Pulmonary Current Smoker,  breath sounds clear to auscultation        Cardiovascular Rhythm:regular Rate:Normal     Neuro/Psych    GI/Hepatic   Endo/Other    Renal/GU      Musculoskeletal   Abdominal   Peds  Hematology  (+) anemia ,   Anesthesia Other Findings       Reproductive/Obstetrics (+) Pregnancy                           Anesthesia Physical Anesthesia Plan  ASA: II  Anesthesia Plan: Epidural   Post-op Pain Management:    Induction:   Airway Management Planned:   Additional Equipment:   Intra-op Plan:   Post-operative Plan:   Informed Consent: I have reviewed the patients History and Physical, chart, labs and discussed the procedure including the risks, benefits and alternatives for the proposed anesthesia with the patient or authorized representative who has indicated his/her understanding and acceptance.   Dental Advisory Given  Plan Discussed with:   Anesthesia Plan Comments: (Labs checked- platelets confirmed with RN in room. Fetal heart tracing, per RN, reported to be stable enough for sitting procedure. Discussed epidural, and patient consents to the procedure:  included risk of possible headache,backache, failed block, allergic reaction, and nerve injury. This patient was asked if she had any questions or concerns before the procedure started.)        Anesthesia Quick Evaluation  

## 2014-02-19 NOTE — Progress Notes (Signed)
   02/19/14 1700  Clinical Encounter Type  Visited With Patient not available;Health care provider  Visit Type Follow-up  Referral From Chaplain   Attempted follow-up support twice this afternoon, but pt was unavailable, first because of providers' presence and second because pt was processing providers' updates and preparing for delivery.  Consulted with RN re POC, who advised f/u tomorrow instead.  Chaplain Dyanne CarrelKaty Claussen is already familiar with Ms Toni ArthursFuller; I will refer her for further support, and will remain available through WU and NICU.  Please also page 24/7 as needs arise:  [phone].  Thank you.  [address]Chaplain Laylaa Guevarra [place]ubbuck[place]unde[place], [place] [phone]

## 2014-02-19 NOTE — Progress Notes (Signed)
amnisure neg

## 2014-02-19 NOTE — Progress Notes (Signed)
Faculty Practice OB/GYN Attending Note  Subjective:  Called to evaluate patient with increased pelvic pressure since last check and feeling like water broke.    Objective:  Blood pressure 102/58, pulse 83, temperature 98.5 F (36.9 C), temperature source Oral, resp. rate 18, height 4\' 11"  (1.499 m), weight 147 lb (66.679 kg), last menstrual period 07/29/2013, SpO2 99 %. FHT  Baseline 145 bpm, reassuring for GA, no decelerations Toco: q3-5 minutes Abdomen: NT gravid fundus, soft Cervix: BBOW/+2/moderate pink fluid in vagina and leaking out of introitus. BBOW still intact, ?high leak Ext: 2+ DTRs, no edema, no cyanosis, negative Homan's sign  Assessment & Plan:  39 y.o. [id number] at 8650w2d admitted for PTL, now with possible PPROM around 1208 today. Amnisure done, but presumptively ordered for latency antibiotics: Gentamicin, Clindamycin IV due to PCN allergy and Azithromycin Continue magnesium sulfate for CP prophylaxis for now. Continue close observation and NPO for now.   Stacey CollinsUGONNA  ANYANWU, MD, FACOG Attending Obstetrician & Gynecologist Faculty Practice, [facility] [place]

## 2014-02-19 NOTE — Progress Notes (Signed)
Patient ID: Stacey Hoffman, female   DOB: [birth year], 39 y.o.   MRN: [id number] FACULTY PRACTICE ANTEPARTUM NOTE  Stacey Hoffman is a 39 y.o. [id number] at 4832w2d  who is admitted for Preterm labor.   Fetal presentation is cephalic. Length of Stay:  3  Days  Subjective: Having intermittent cramping this morning.  Contractions and cramping resolved after Procardia IR 10mg  last night in addition to 30mg  XL.  Had some gushes of fluid - amnisure negative last night and fern negative this AM - did have urine crystal on slide. Patient reports good fetal movement.  No bleeding and no loss of fluid per vagina.  Vitals:  Blood pressure 109/55, pulse 89, temperature 98.5 F (36.9 C), temperature source Oral, resp. rate 18, height 4\' 11"  (1.499 m), weight 147 lb (66.679 kg), last menstrual period 07/29/2013, SpO2 99 %. Physical Examination:  General appearance - alert, well appearing, and in no distress Mental status - alert, oriented to person, place, and time Chest - clear to auscultation, no wheezes, rales or rhonchi, symmetric air entry Heart - normal rate, regular rhythm, normal S1, S2, no murmurs, rubs, clicks or gallops Abdomen - soft, nontender, nondistended, no masses or organomegaly Fundal Height:  size equals dates Extremities: extremities normal, atraumatic, no cyanosis or edema Membranes:intact  Fetal Monitoring:  Baseline: 150 bpm, Variability: Fair (1-6 bpm), Accelerations: Non-reactive but appropriate for gestational age and Decelerations: Variable: mild  Labs:  Results for orders placed or performed during the hospital encounter of 02/16/14 (from the past 24 hour(s))  Group B strep by PCR   Collection Time: 02/18/14  2:10 PM  Result Value Ref Range   Group B strep by PCR NEGATIVE NEGATIVE  Amnisure rupture of membrane (rom)   Collection Time: 02/18/14  9:44 PM  Result Value Ref Range   Amnisure ROM NEGATIVE     Imaging Studies:    EFW: 645g  Open Cervix  Medications:   Scheduled . acyclovir  400 mg Oral TID  . docusate sodium  100 mg Oral BID  . methadone  10 mg Oral TID  . methadone  20 mg Oral QHS  . nicotine  14 mg Transdermal Daily  . NIFEdipine  30 mg Oral BID  . prenatal multivitamin  1 tablet Oral Q1200  . progesterone  200 mg Vaginal QHS   I have reviewed the patient's current medications.  ASSESSMENT: Patient Active Problem List   Diagnosis Date Noted  . Premature labor   . Short cervical length during pregnancy   . Preterm labor 02/17/2014  . Previous cesarean delivery x 3, antepartum 02/17/2014  . Opioid dependence 02/17/2014  . History of herpes genitalis 02/17/2014  . Smoker 02/17/2014  . Short cervix affecting pregnancy 02/17/2014  . Rubella non-immune status, antepartum 02/17/2014  . AMA (advanced maternal age) multigravida 35+ 02/17/2014  . H/O LEEP 02/17/2014  . [redacted] weeks gestation of pregnancy     PLAN: Increase procardia XL to 60mg  BID Continue home medications. Continue bed rest S/p BMZ x 2 Continuous monitoring. Consented for RLTCS Continue prometrium. Continue routine antenatal care.   Hoffman, Stacey JEHIEL 02/19/2014,9:01 AM

## 2014-02-19 NOTE — Progress Notes (Signed)
Faculty Practice OB/GYN Attending Note  Subjective:  Called to evaluate patient with increased contraction frequency and pain despite esclating doses of Procardia FHR reassuring, q 3-5 min contractions, no LOF or vaginal bleeding. Good FM.   Admitted on 02/16/2014 for Preterm labor.   Objective:  Blood pressure 99/50, pulse 87, temperature 98.5 F (36.9 C), temperature source Oral, resp. rate 18, height 4\' 11"  (1.499 m), weight 147 lb (66.679 kg), last menstrual period 07/29/2013, SpO2 99 %. FHT  Baseline 150 bpm reassuring for GA Toco: q3-5 minutes Gen: NAD Abdomen: NT gravid fundus, soft Cervix: No cervix palpated, just intact BBOW with presenting part at +2 station. At least 6-7 cm dilated Ext: 2+ DTRs, no edema, no cyanosis, negative Homan's sign  02/18/14 EFW 645 g (1-7)/48%, cephalic  Assessment & Plan:  39 y.o. [id number] at 2535w2d admitted for preterm labor, now progressing in spite of Procardia. Will stop Procardia for now, restart magnesium sulfate 6 g load, 2 g/hr. May add on Indocin if needed. Dr. Eric FormWimmer (Neonatologist) in room during encounter and answered patient's and FOB's questions regarding fetal prognosis. NPO for now.   Continue close observation.  Stacey CollinsUGONNA  Amyjo Mizrachi, MD, FACOG Attending Obstetrician & Gynecologist Faculty Practice, [facility] [place]

## 2014-02-19 NOTE — Progress Notes (Signed)
ANTIBIOTIC CONSULT NOTE - INITIAL  Pharmacy Consult for Gentamicin Indication: PPROM  Allergies  Allergen Reactions  . Eggs Or Egg-Derived Products Hives  . Penicillins Other (See Comments)    Childhood reaction unknown  . Shellfish Allergy Hives  . Sulfa Antibiotics Swelling    Patient Measurements: Height: 4\' 11"  (149.9 cm) Weight: 147 lb (66.679 kg) IBW/kg (Calculated) : 43.2 kg Adjusted Body Weight: 50 kg  Vital Signs: Temp: 98.5 F (36.9 C) (01/05 0814) Temp Source: Oral (01/05 0814) BP: 102/58 mmHg (01/05 1200) Pulse Rate: 83 (01/05 1200)  Labs:  Recent Labs  02/17/14 0120  WBC 24.3*  HGB 9.9*  PLT 292      Microbiology: Recent Results (from the past 720 hour(s))  OB RESULT CONSOLE Group B Strep     Status: None   Collection Time: 02/16/14 12:00 AM  Result Value Ref Range Status   GBS Negative  Final  Group B strep by PCR     Status: None   Collection Time: 02/18/14  2:10 PM  Result Value Ref Range Status   Group B strep by PCR NEGATIVE NEGATIVE Final    Medications:  Clindamycin 900 mg IV Q 8 hr  Assessment: 39 y.o. female G5P2203 at 804w2d now with PPROM Estimated Ke = 0.229 , Vd = 0.32 L/kg  Goal of Therapy:  Gentamicin peak 6-8 mg/L and Trough < 1 mg/L  Plan:  Gentamicin 100 mg IV every 12 hrs  Check Scr with next labs if gentamicin continued. Will check gentamicin levels if continued > 72hr or clinically indicated.  Natasha BenceCline, Merrilee Ancona 02/19/2014,12:46 PM

## 2014-02-19 NOTE — Progress Notes (Signed)
Pt c/o increased rectal pressure ? Srom, towel between legs saturated, ? Urine vs amniotic fluid, pink tinged spot on towel. Dr Adrian BlackwaterStinson notified, will be in to evaluate. Procardia PRN dosage given.

## 2014-02-19 NOTE — Progress Notes (Signed)
Pt reports cramping and pressure is better since procardia dose at 0634. Crist FatFern was negative.

## 2014-02-19 NOTE — Anesthesia Procedure Notes (Signed)
Epidural Patient location during procedure: OB  Preanesthetic Checklist Completed: patient identified, site marked, surgical consent, pre-op evaluation, timeout performed, IV checked, risks and benefits discussed and monitors and equipment checked  Epidural Patient position: right lateral decubitus Prep: site prepped and draped and DuraPrep Patient monitoring: continuous pulse ox and blood pressure Approach: midline Location: L3-L4 Injection technique: LOR air  Needle:  Needle type: Tuohy  Needle gauge: 17 G Needle length: 9 cm and 9 Needle insertion depth: 6 cm Catheter type: closed end flexible Catheter size: 19 Gauge Catheter at skin depth: 12 cm Test dose: negative  Assessment Events: blood not aspirated, injection not painful, no injection resistance, negative IV test and no paresthesia  Additional Notes Dosing of Epidural:  1st dose, through catheter ............................................Marland Kitchen.  Xylocaine 40 mg  2nd dose, through catheter, after waiting 3 minutes........Marland Kitchen.Xylocaine 60 mg    ( 1% Xylo charted as a single dose in Epic Meds for ease of charting; actual dosing was fractionated as above, for saftey's sake)  As each dose occurred, patient was free of IV sx; and patient exhibited no evidence of SA injection.  Patient is more comfortable after epidural dosed. Please see RN's note for documentation of vital signs,and FHR which are stable.  Patient reminded not to try to ambulate with numb legs, and that an RN must be present when she attempts to get up.

## 2014-02-20 ENCOUNTER — Encounter (HOSPITAL_COMMUNITY): Payer: Self-pay | Admitting: *Deleted

## 2014-02-20 MED ORDER — DIPHENHYDRAMINE HCL 25 MG PO CAPS: 25.0000 mg | ORAL_CAPSULE | Freq: Four times a day (QID) | ORAL | Status: DC | PRN

## 2014-02-20 MED ORDER — SIMETHICONE 80 MG PO CHEW: 80.0000 mg | CHEWABLE_TABLET | ORAL | Status: DC | PRN

## 2014-02-20 MED ORDER — OXYTOCIN 10 UNIT/ML IJ SOLN
INTRAMUSCULAR | Status: AC
  Administered 2014-02-20: 10 [IU]
  Filled 2014-02-20: qty 1

## 2014-02-20 MED ORDER — MEASLES, MUMPS & RUBELLA VAC ~~LOC~~ INJ
0.5000 mL | INJECTION | Freq: Once | SUBCUTANEOUS | Status: AC
  Administered 2014-02-21: 0.5 mL via SUBCUTANEOUS
  Filled 2014-02-20 (×2): qty 0.5

## 2014-02-20 MED ORDER — OXYTOCIN 40 UNITS IN LACTATED RINGERS INFUSION - SIMPLE MED
INTRAVENOUS | Status: AC
  Filled 2014-02-20: qty 1000

## 2014-02-20 MED ORDER — OXYCODONE-ACETAMINOPHEN 5-325 MG PO TABS: 1.0000 | ORAL_TABLET | ORAL | Status: DC | PRN

## 2014-02-20 MED ORDER — PRENATAL MULTIVITAMIN CH
1.0000 | ORAL_TABLET | Freq: Every day | ORAL | Status: DC
  Administered 2014-02-21 – 2014-02-22 (×2): 1 via ORAL
  Filled 2014-02-20 (×2): qty 1

## 2014-02-20 MED ORDER — LANOLIN HYDROUS EX OINT: TOPICAL_OINTMENT | CUTANEOUS | Status: DC | PRN

## 2014-02-20 MED ORDER — ZOLPIDEM TARTRATE 5 MG PO TABS: 5.0000 mg | ORAL_TABLET | Freq: Every evening | ORAL | Status: DC | PRN

## 2014-02-20 MED ORDER — ONDANSETRON HCL 4 MG PO TABS: 4.0000 mg | ORAL_TABLET | ORAL | Status: DC | PRN

## 2014-02-20 MED ORDER — OXYTOCIN 40 UNITS IN LACTATED RINGERS INFUSION - SIMPLE MED: 62.5000 mL/h | INTRAVENOUS | Status: DC

## 2014-02-20 MED ORDER — ONDANSETRON HCL 4 MG/2ML IJ SOLN: 4.0000 mg | INTRAMUSCULAR | Status: DC | PRN

## 2014-02-20 MED ORDER — OXYTOCIN BOLUS FROM INFUSION
500.0000 mL | INTRAVENOUS | Status: DC
  Administered 2014-02-20: 500 mL via INTRAVENOUS

## 2014-02-20 MED ORDER — WITCH HAZEL-GLYCERIN EX PADS: 1.0000 "application " | MEDICATED_PAD | CUTANEOUS | Status: DC | PRN

## 2014-02-20 MED ORDER — SENNOSIDES-DOCUSATE SODIUM 8.6-50 MG PO TABS
2.0000 | ORAL_TABLET | ORAL | Status: DC
  Administered 2014-02-20 – 2014-02-21 (×2): 2 via ORAL
  Filled 2014-02-20 (×2): qty 2

## 2014-02-20 MED ORDER — BENZOCAINE-MENTHOL 20-0.5 % EX AERO
1.0000 "application " | INHALATION_SPRAY | CUTANEOUS | Status: DC | PRN
  Administered 2014-02-20: 1 via TOPICAL
  Filled 2014-02-20: qty 56

## 2014-02-20 MED ORDER — TETANUS-DIPHTH-ACELL PERTUSSIS 5-2.5-18.5 LF-MCG/0.5 IM SUSP
0.5000 mL | Freq: Once | INTRAMUSCULAR | Status: AC
  Administered 2014-02-21: 0.5 mL via INTRAMUSCULAR

## 2014-02-20 MED ORDER — DIBUCAINE 1 % RE OINT: 1.0000 "application " | TOPICAL_OINTMENT | RECTAL | Status: DC | PRN

## 2014-02-20 MED ORDER — IBUPROFEN 600 MG PO TABS
600.0000 mg | ORAL_TABLET | Freq: Four times a day (QID) | ORAL | Status: DC
  Administered 2014-02-20 – 2014-02-22 (×8): 600 mg via ORAL
  Filled 2014-02-20 (×9): qty 1

## 2014-02-20 MED ORDER — OXYCODONE-ACETAMINOPHEN 5-325 MG PO TABS: 2.0000 | ORAL_TABLET | ORAL | Status: DC | PRN

## 2014-02-20 NOTE — Progress Notes (Signed)
Faculty Practice OB/GYN Attending Note  Subjective:  Called to evaluate patient with increased pelvic pressure and pain. Received epidural yesterday afternoon for pain..    Objective:  Blood pressure 91/47, pulse 71, temperature 97.9 F (36.6 C), temperature source Oral, resp. rate 20, height 4\' 11"  (1.499 m), weight 147 lb (66.679 kg), last menstrual period 07/29/2013, SpO2 96 %. FHT  Baseline 145 bpm, reassuring for GA, no decelerations Toco: q3-5 minutes Abdomen: NT gravid fundus, soft Cervix: Fully dilated/+3/no forebag/copious amount of bloody fluid Ext: 2+ DTRs, no edema, no cyanosis, negative Homan's sign  Assessment & Plan:  39 y.o. [id number] at 6544w3d admitted for PTL,with PPROM around 1208 02/19/14 now with imminent delivery. Will start pushing Hopeful for vaginal delivery. NICU team informed and are present now.   [name], MD, FACOG Attending Obstetrician & Gynecologist Faculty Practice, [facility] [place]

## 2014-02-20 NOTE — Anesthesia Postprocedure Evaluation (Signed)
  Anesthesia Post-op Note  Anesthesia Post Note  Patient: Stacey Hoffman  Procedure(s) Performed: * No procedures listed *  Anesthesia type: Epidural  Patient location: Mother/Baby  Post pain: Pain level controlled  Post assessment: Post-op Vital signs reviewed  Last Vitals:  Filed Vitals:   02/20/14 0625  BP: 103/43  Pulse: 80  Temp: 37.3 C  Resp: 18    Post vital signs: Reviewed  Level of consciousness:alert  Complications: No apparent anesthesia complications

## 2014-02-20 NOTE — Lactation Note (Addendum)
This note was copied from the chart of Stacey Hoffman. Lactation Consultation Note Initial consultation; baby 6 hours old. Mom states she is committed to providing breast milk to her baby in the NICU.  NICU breastmilk book reviewed with mom. DEP set up and reviewed with mom. Mom pumped for 15 minutes and did not get any colostrum. During hand expression, mom hand expressed about 5 ml. Colostrum bottle labeled and mom is going to take it to baby's bedside this morning.  Discussed pumping and hand expression at length with mom, answered all questions. Written instructions provided for mom to pump 8 times a day, at least once at night, and hand express after pumping.  Mom states she wants to quit smoking, and she is wearing nicotine patch. Encouraged mom to provide breast milk to baby even with patch on, and encouraged mom to stick to her commitment to quit smoking, especially given her baby's fragile medical condition.   Mom anticipates d/c on Friday, 2 days from now. Mom has BC/BS and is not active with WIC. Mom has called BC/BS to get a pump. Discussed with mom the option to rent a hospital grade pump. Mom is considering this option, especially if her pump does not arrive before she is d/c from hospital.   Lactation brochure provided, mom informed of lactation support services and BFSG.    Patient Name: Stacey Ricke Heylice Puzzo ZOXWR'UToday's Date: 02/20/2014 Reason for consult: Initial assessment   Maternal Data Formula Feeding for Exclusion: Yes Reason for exclusion: Admission to Intensive Care Unit (ICU) post-partum Does the patient have breastfeeding experience prior to this delivery?: Yes  Feeding    LATCH Score/Interventions                      Lactation Tools Discussed/Used WIC Program: No Pump Review: Setup, frequency, and cleaning;Milk Storage Initiated by:: BS Date initiated:: 02/20/14   Consult Status Consult Status: Follow-up Follow-up type: In-patient    Octavio MannsSanders,  Mika Griffitts [facility] 02/20/2014, 10:17 AM

## 2014-02-20 NOTE — Progress Notes (Signed)
Pt was on her way to walk her family downstairs when I came in, but asked if I could come back.  I stopped by again and she was not yet back.  Spiritual Care will attempt follow-up care in the next several days, but please page as needs arise or as pt requests.  [employer] CorporationChaplain Katy Hanad Leino Pager, [phone] 4:20 PM    02/20/14 1600  Clinical Encounter Type  Visited With Patient  Visit Type Follow-up

## 2014-02-20 NOTE — Progress Notes (Signed)
svd of premature boy. NICU team attending to baby. Awaiting placenta

## 2014-02-20 NOTE — Progress Notes (Signed)
Pt with increase in bloody amniotic fluid and right sided pain. Dr Macon LargeAnyanwu notified, in room to check pt. Pt fully and +3, pt to push with ctx. Team called prior to pushing. Team standing by.

## 2014-02-21 ENCOUNTER — Encounter (HOSPITAL_COMMUNITY): Payer: Self-pay | Admitting: Advanced Practice Midwife

## 2014-02-21 NOTE — Progress Notes (Signed)
   02/21/14 1600  Clinical Encounter Type  Visited With Patient not available   Attempted follow-up visit, but pt not in her room or in NICU.  Spiritual Care remains available and will continue to attempt visits, but please also page as needs arise.  Thank you.  [address]Chaplain Mahealani Sulak WayneLunde[place], [place] [phone]

## 2014-02-21 NOTE — Progress Notes (Signed)
CSW attempted to meet with MOB to offer support and complete assessment due to admission to NICU at 24.3 weeks, but she was resting at this time and requested that CSW return at a later time.  CSW will attempt to meet with MOB another time.

## 2014-02-21 NOTE — Progress Notes (Signed)
Post Partum Day 1 Subjective: no complaints, up ad lib, voiding, tolerating PO and + flatus  Baby in NICU. Decreasing O2 needs. May be extubated today. Pt has been to visit often. Pumping.   Objective: Blood pressure 102/46, pulse 75, temperature 99.7 F (37.6 C), temperature source Oral, resp. rate 20, height 4\' 11"  (1.499 m), weight 66.679 kg (147 lb), last menstrual period 07/29/2013, SpO2 99 %, unknown if currently breastfeeding.  Physical Exam:  General: alert, cooperative, appears stated age and no distress Lochia: appropriate Uterine Fundus: firm Incision: NA DVT Evaluation: Negative Homan's sign.  No results for input(s): HGB, HCT in the last 72 hours.  Assessment/Plan: Plan for discharge tomorrow, Lactation consult and Social Work consult    LOS: 5 days   Dorathy KinsmanSMITH, Ameir Faria 02/21/2014, 8:45 PM

## 2014-02-22 MED ORDER — PNEUMOCOCCAL VAC POLYVALENT 25 MCG/0.5ML IJ INJ
0.5000 mL | INJECTION | Freq: Once | INTRAMUSCULAR | Status: AC
  Administered 2014-02-22: 0.5 mL via INTRAMUSCULAR
  Filled 2014-02-22: qty 0.5

## 2014-02-22 NOTE — Discharge Summary (Signed)
Obstetric Discharge Summary Reason for Admission: preterm labor Prenatal Procedures: betamethasone, magnesium, latency antibiotics started Intrapartum Procedures: spontaneous vaginal delivery Postpartum Procedures: none Complications-Operative and Postpartum: none  Delivery Note At 4:07 AM a viable female was delivered via VBAC, Spontaneous (Presentation: ; Occiput Anterior).  APGAR: 3, 5; weight 1 lb 9.8 oz (731 g).   Placenta status: Intact, Spontaneous Pathology.  Cord: 3 vessels with the following complications: None.  Cord pH: 7.362  Anesthesia: Epidural  Episiotomy: None Lacerations:  none Suture Repair: n/a Est. Blood Loss (mL): 200  Mom to postpartum.  Baby to NICU.  [name] Stacey Hoffman 02/22/2014, 2:41 PM     Hospital Course:  Principal Problem:   Preterm labor Active Problems:   Previous cesarean delivery x 3, antepartum   Opioid dependence   History of herpes genitalis   Smoker   Short cervix affecting pregnancy   Rubella non-immune status, antepartum   AMA (advanced maternal age) multigravida 35+   H/O LEEP   [redacted] weeks gestation of pregnancy   Premature labor   Short cervical length during pregnancy   Stacey Hoffman is a 39 y.o. [phone] s/p preterm delivery at [redacted]w[redacted]d.    Presenting as transfer from [place] for preterm labor at [redacted]w[redacted]d and need for level 3 NICU. Known short cx of 1.9cm w/ funneling since anatomy scan, was placed on nightly prometrium. Reported uc's/pressure today, was 3cm w/ membranes visible but not protruding through os. GBS, GC/CT, UA, UDS were collected. UDS + for opiates and methadone. UA neg. Was given mag 4gm loading dose, then 1gm/hr which was subsequently increased to 2 then to 2.5 d/t uc's. BMZ  IM @ 2030, did receive additional BMZ as only received  instead of  and 2nd dose as well . Vanc 1gm IV @ 2200 for gbs prophylaxis and PCN allergy. Indocin .  PPROM occurred around 1208 on 02/19/14, and she was started on latency  antibiotics, amnisure negative but pt with significant leakage of blood tinged clear fluid.  On Chronic opioid use d/t chronic hip pain- goes to [facility] in [place] and sees Dr. Osborne Oman- current regimen is Oxycodone IR  TID and was recently started on methadone on 12/3 and regimen is  TID and  q hs.     She has postpartum course that was uncomplicated including no problems with ambulating, PO intake, urination, pain, or bleeding. The pt feels ready to go home and  will be discharged with outpatient follow-up.   Today: No acute events overnight.  Pt denies problems with ambulating, voiding or po intake.  She denies nausea or vomiting.  Pain is well controlled.  Plan for birth control is  depo.  Method of Feeding: breast and bottle, renting pump to continue pumping   H/H: Lab Results  Component Value Date/Time   HGB 9.9* 02/17/2014 01:20 AM   HCT 28.6* 02/17/2014 01:20 AM    Discharge Diagnoses: Term Pregnancy-delivered  Discharge Information: Date: 02/22/2014 Activity: pelvic rest Diet: routine  Medications: None Breast feeding:  Yes Condition: stable Instructions: refer to handout Discharge to: home   Discharge Instructions    Call MD for:  severe uncontrolled pain    Complete by:  As directed      Call MD for:  temperature >100.4    Complete by:  As directed      Diet - low sodium heart healthy    Complete by:  As directed  Medication List    TAKE these medications        CITRACAL PLUS Tabs  Take 1 tablet by mouth 3 (three) times daily.     ferrous sulfate 325 (65 FE) MG tablet  Take 325 mg by mouth daily with breakfast.     methadone 10 MG tablet  Commonly known as:  DOLOPHINE  Take 10 mg by mouth 3 (three) times daily.     methadone 10 MG tablet  Commonly known as:  DOLOPHINE  Take 20 mg by mouth at bedtime as needed.     Oxycodone HCl 10 MG Tabs  Take 10 mg by mouth 3 (three) times daily. Pt states to take  alternating with the methadone.     prenatal multivitamin Tabs tablet  Take 1 tablet by mouth daily at 12 noon.         Perry MountACOSTA,Stacey Hoffman Stacey Hoffman ,MD OB Fellow 02/22/2014,2:41 PM

## 2014-02-22 NOTE — Progress Notes (Signed)
CSW attempted again to meet with MOB, but she was not in her room at this time.  CSW attempted to meet with parents at baby's bedside in NICU, but they were talking with RN and NNP.  CSW contacted MOB's bedside RN on Women's Unit to request a call when MOB returns to her room. 
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
## 2014-02-22 NOTE — Progress Notes (Signed)
Pt discharged home with husband... Discharge instructions reviewed with pt and she verbalized understanding... Condition stable... No equipment... Ambulated to car with E. Barrie Wale, RN.  
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
## 2014-06-25 NOTE — H&P (Signed)
L&D Evaluation:  History:  HPI 39yo [id number] at 23.6 by 13w US with EDC of 06/09/14 and known short cervix presents with painful contractions and low back pain, found to be effaced and dilated to 3cm with a visible membranes at but not protruding through the cervical os.  no LOF, no VB, +FM.  Pregnancy issues: 1. history of prior c/s x3 (term, term, 36w) 2. chronic opiod dependence for hip pain: Methadone 10mg  3x day plus 20mg  at bedtime, Oxycodone 10mg  3x per day.  Methadone managed by [facility]. 3. PCN allergic 4. Hx HSV, no recent outbreaks or viral illness 5. smoker - 1.5ppd 6. Hx of LEEP (1998)  7. short cervix found  on anatomy scan 1.9cm +funnelling.  started on vag prometrium  @ 20 wks qhs. 8. rubella non-immune 9. AMA, NIPT neg, XY  O+ / Abneg / RNI / VI / HIV NR / RPR NR / Hb 11.3 / Plt 363 / HbsAG neg / Pap neg, HPV neg / NIPT neg, XY PMH: ADHD, Anxiety/depression, Chronic pain syndrome, HSV2, high cholesterol PSH: LEEP, C/S x3 OB Hx: 21 wk miscarriage of twins, 2000: C/S term female, 2003: C/S 36w IUGR, 2006: C/S term female GYN Hx: HSV, h/o LEEP, last pap 2014 LGSIL, HPV neg Allergies: PCN (unknown reaction), sulfa, eggs, shellfish  Bedside Ultrasound exam: (briefly performed with vascular probe) cephalic, adequate fluid, fundal placenta without echogenecity.   Presents with back pain, contractions   Patient's Medical History as above in HPI   Patient's Surgical History LEEP  Previous C/S x3   Medications Oxycodone 10mg  TID, Methadone 10mg  TID and 20mg  at bedtime   Allergies PCN, Sulfa, egg, shellfish   Social History tobacco  opioid dependence   Family History Non-Contributory   ROS:  ROS as in HPI   Exam:  Vital Signs stable   Urine Protein negative dipstick   General painful contractions   Mental Status clear   Chest clear   Heart normal sinus rhythm   Abdomen gravid, tender with contractions   Estimated Fetal Weight Average for  gestational age   Fetal Position cephalic   Back no CVAT   Edema no edema   Reflexes 2+   Clonus negative   Pelvic no external lesions, 3cm with membranes at but not through cervical os, no lesions on external or internal exam   Mebranes Intact   FHT normal rate with no decels, no accels, mod variability   Fetal Heart Rate 140   Ucx q2-3 mins, unable to trace on toco   Impression:  Impression PTL   Plan:  Plan EFM/NST, monitor contractions and for cervical change   Comments 39yo G5P2003 at 23.6 with preterm labor  1. preterm labor -magnesium sulfate 4g loading dose followed by 1g/hr gtt for neuroprotection, may titrate up for additional tocolysis PRN. -indocin 50mg  loading dose followed by 25mg  q6h x 48hrs for tocolysis -betamethasone 6gm IM in 2 separate doses 24hrs apart for fetal lung maturity   -GBS unknown, PCN allergic, unknown sensitivities - start Vancomycin 1g q12hrs. -Transfer of care initiated with [facility] [place] due to early gestational age.  Spoke with accepting physician, Dr. Jolayne Pantheronstant -UA, UDS, GBS, GC/CT collected and sent. -continuous toco/efm  2. opoid dependence -continue known regimen -was to have an anesthesia consult due to opioid use  3. prior C/S x3 due to gestational age/fetal size, will for now delay decision for repeat c/s.  [facility]   Electronic Signatures: Ward,  Elenora Fenderhelsea C (MD)  (Signed 02-Jan-16 22:06)  Authored: L&D Evaluation   Last Updated: 02-Jan-16 22:06 by Ward, Elenora Fenderhelsea C (MD)
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
## 2014-08-05 ENCOUNTER — Inpatient Hospital Stay: Payer: BLUE CROSS/BLUE SHIELD | Admitting: Internal Medicine

## 2014-08-12 ENCOUNTER — Encounter: Payer: Self-pay | Admitting: Emergency Medicine

## 2014-08-12 ENCOUNTER — Emergency Department: Payer: 59

## 2014-08-12 ENCOUNTER — Emergency Department
Admission: EM | Admit: 2014-08-12 | Discharge: 2014-08-12 | Disposition: A | Discharge status: Home / Self Care | Payer: 59 | Attending: Emergency Medicine | Admitting: Emergency Medicine

## 2014-08-12 DIAGNOSIS — Z720 Tobacco use: Secondary | ICD-10-CM | POA: Insufficient documentation

## 2014-08-12 DIAGNOSIS — N760 Acute vaginitis: Secondary | ICD-10-CM | POA: Insufficient documentation

## 2014-08-12 DIAGNOSIS — R102 Pelvic and perineal pain: Secondary | ICD-10-CM

## 2014-08-12 DIAGNOSIS — N939 Abnormal uterine and vaginal bleeding, unspecified: Secondary | ICD-10-CM | POA: Diagnosis present

## 2014-08-12 DIAGNOSIS — Z79899 Other long term (current) drug therapy: Secondary | ICD-10-CM | POA: Diagnosis not present

## 2014-08-12 DIAGNOSIS — Z880 Allergy status to penicillin: Secondary | ICD-10-CM | POA: Insufficient documentation

## 2014-08-12 DIAGNOSIS — B9689 Other specified bacterial agents as the cause of diseases classified elsewhere: Secondary | ICD-10-CM

## 2014-08-12 LAB — URINALYSIS COMPLETE WITH MICROSCOPIC (ARMC ONLY)
Bacteria, UA: NONE SEEN
Bilirubin Urine: NEGATIVE
GLUCOSE, UA: NEGATIVE mg/dL
KETONES UR: NEGATIVE mg/dL
LEUKOCYTES UA: NEGATIVE
Nitrite: NEGATIVE
PH: 6 (ref 5.0–8.0)
PROTEIN: NEGATIVE mg/dL
Specific Gravity, Urine: 1.005 (ref 1.005–1.030)

## 2014-08-12 LAB — ABO/RH
ABO/RH(D): O POS
ABO/RH(D): O POS

## 2014-08-12 LAB — CBC
HCT: 37.1 % (ref 35.0–47.0)
Hemoglobin: 12.3 g/dL (ref 12.0–16.0)
MCH: 30.8 pg (ref 26.0–34.0)
MCHC: 33.3 g/dL (ref 32.0–36.0)
MCV: 92.5 fL (ref 80.0–100.0)
Platelets: 392 10*3/uL (ref 150–440)
RBC: 4.01 MIL/uL (ref 3.80–5.20)
RDW: 13.8 % (ref 11.5–14.5)
WBC: 18.3 10*3/uL — AB (ref 3.6–11.0)

## 2014-08-12 LAB — WET PREP, GENITAL
Trich, Wet Prep: NONE SEEN
WBC, Wet Prep HPF POC: NONE SEEN
Yeast Wet Prep HPF POC: NONE SEEN

## 2014-08-12 LAB — CHLAMYDIA/NGC RT PCR (ARMC ONLY)
CHLAMYDIA TR: NOT DETECTED
N gonorrhoeae: NOT DETECTED

## 2014-08-12 LAB — POCT PREGNANCY, URINE: PREG TEST UR: NEGATIVE

## 2014-08-12 LAB — HCG, QUANTITATIVE, PREGNANCY: hCG, Beta Chain, Quant, S: 46 m[IU]/mL — ABNORMAL HIGH (ref <5)

## 2014-08-12 MED ORDER — METRONIDAZOLE 500 MG PO TABS: 500.0000 mg | ORAL_TABLET | Freq: Two times a day (BID) | ORAL | Status: DC

## 2014-08-12 NOTE — ED Notes (Signed)
Patient transported to Ultrasound 

## 2014-08-12 NOTE — ED Notes (Signed)
Pt. Back from US

## 2014-08-12 NOTE — ED Provider Notes (Signed)
[facility] Emergency Department Provider Note  ____________________________________________  Time seen: Approximately 5:49 AM  I have reviewed the triage vital signs and the nursing notes.   HISTORY  Chief Complaint Vaginal Bleeding    HPI Stacey Hoffman is a 39 y.o. female who presents with pelvic pain and vaginal bleeding. Patient is a G6 P4 AB 2 who had an elective abortion on June 10. Patient states she has had continued bleeding since. Past and large clots on Thursday night, bleeding lessened on Friday, then resumed more heavily over the past 2 days associated with pelvic cramping. Patient also states there is a foul odor to her bleeding. Complains of generalized weakness. Denies fever, chills, nausea, vomiting, dysuria. Patient is 6 months postpartum not breast-feeding.Patient brings in a sample of her blood clot that she passed several days ago which she feelshas tissue in addition to blood in it. States she has a history of elevated white blood cells and is being referred to the hematology clinic for further follow-up.   Past Medical History  Diagnosis Date  . Anxiety   . Back pain   . Gastritis     Patient Active Problem List   Diagnosis Date Noted  . Premature labor   . Short cervical length during pregnancy   . Preterm labor 02/17/2014  . Previous cesarean delivery x 3, antepartum 02/17/2014  . Opioid dependence 02/17/2014  . History of herpes genitalis 02/17/2014  . Smoker 02/17/2014  . Short cervix affecting pregnancy 02/17/2014  . Rubella non-immune status, antepartum 02/17/2014  . AMA (advanced maternal age) multigravida 35+ 02/17/2014  . H/O LEEP 02/17/2014  . [redacted] weeks gestation of pregnancy     Past Surgical History  Procedure Laterality Date  . Cesarean section    . Iud removal N/A     IUD imbeded into uterus    Current Outpatient Rx  Name  Route  Sig  Dispense  Refill  . ferrous sulfate 325 (65 FE) MG tablet   Oral    Take 325 mg by mouth daily with breakfast.         . methadone (DOLOPHINE) 10 MG tablet   Oral   Take 10 mg by mouth 3 (three) times daily.         . methadone (DOLOPHINE) 10 MG tablet   Oral   Take 20 mg by mouth at bedtime as needed.         . Multiple Minerals-Vitamins (CITRACAL PLUS) TABS   Oral   Take 1 tablet by mouth 3 (three) times daily.         . Oxycodone HCl 10 MG TABS   Oral   Take 10 mg by mouth 3 (three) times daily. Pt states to take alternating with the methadone.         . Prenatal Vit-Fe Fumarate-FA (PRENATAL MULTIVITAMIN) TABS tablet   Oral   Take 1 tablet by mouth daily at 12 noon.           Allergies Eggs or egg-derived products; Penicillins; Shellfish allergy; and Sulfa antibiotics  No family history on file.  Social History History  Substance Use Topics  . Smoking status: Current Every Day Smoker -- 1.50 packs/day for 15 years    Types: Cigarettes  . Smokeless tobacco: Not on file  . Alcohol Use: No    Review of Systems Constitutional: No fever/chills Eyes: No visual changes. ENT: No sore throat. Cardiovascular: Denies chest pain. Respiratory: Denies shortness of breath. Gastrointestinal: Positive  for pelvic pain.  No nausea, no vomiting.  No diarrhea.  No constipation. Genitourinary: Positive for vaginal bleeding.Negative for dysuria. Musculoskeletal: Negative for back pain. Skin: Negative for rash. Neurological: Negative for headaches, focal weakness or numbness.  10-point ROS otherwise negative.  ____________________________________________   PHYSICAL EXAM:  VITAL SIGNS: ED Triage Vitals  Enc Vitals Group     BP 08/12/14 0115 111/71 mmHg     Pulse Rate 08/12/14 0115 82     Resp 08/12/14 0115 20     Temp 08/12/14 0115 98 F (36.7 C)     Temp Source 08/12/14 0115 Oral     SpO2 08/12/14 0115 99 %     Weight 08/12/14 0115 131 lb (59.421 kg)     Height 08/12/14 0115 4\' 11"  (1.499 m)     Head Cir --      Peak Flow  --      Pain Score 08/12/14 0134 7     Pain Loc --      Pain Edu? --      Excl. in GC? --     Constitutional: Alert and oriented. Well appearing and in no acute distress. Eyes: Conjunctivae are normal. PERRL. EOMI. Head: Atraumatic. Nose: No congestion/rhinnorhea. Mouth/Throat: Mucous membranes are moist.  Oropharynx non-erythematous. Neck: No stridor.   Cardiovascular: Normal rate, regular rhythm. Grossly normal heart sounds.  Good peripheral circulation. Respiratory: Normal respiratory effort.  No retractions. Lungs CTAB. Gastrointestinal: Soft and nontender. No distention. No abdominal bruits. No CVA tenderness. Pelvic: No vesicular lesions on external exam. Speculum exam reveals mild vaginal bleeding with closed os; no foul odor. Bimanual exam within normal limits. Musculoskeletal: No lower extremity tenderness nor edema.  No joint effusions. Neurologic:  Normal speech and language. No gross focal neurologic deficits are appreciated. Speech is normal. No gait instability. Skin:  Skin is warm, dry and intact. No rash noted. Psychiatric: Mood and affect are normal. Speech and behavior are normal.  ____________________________________________   LABS (all labs ordered are listed, but only abnormal results are displayed)  Labs Reviewed  WET PREP, GENITAL - Abnormal; Notable for the following:    Clue Cells Wet Prep HPF POC MANY (*)    All other components within normal limits  CBC - Abnormal; Notable for the following:    WBC 18.3 (*)    All other components within normal limits  HCG, QUANTITATIVE, PREGNANCY - Abnormal; Notable for the following:    hCG, Beta Chain, Quant, S 46 (*)    All other components within normal limits  URINALYSIS COMPLETEWITH MICROSCOPIC ([facility] ONLY) - Abnormal; Notable for the following:    Color, Urine STRAW (*)    APPearance CLEAR (*)    Hgb urine dipstick 3+ (*)    Squamous Epithelial / LPF 0-5 (*)    All other components within normal limits   CHLAMYDIA/NGC RT PCR ([facility] ONLY)  POC URINE PREG, ED  POCT PREGNANCY, URINE  ABO/RH  ABO/RH   ____________________________________________  EKG  None ____________________________________________  RADIOLOGY  Pelvic ultrasound interpreted per Dr. Clovis Riley: No significant abnormality.  ____________________________________________   PROCEDURES  Procedure(s) performed: None  Critical Care performed: No  ____________________________________________   INITIAL IMPRESSION / ASSESSMENT AND PLAN / ED COURSE  Pertinent labs & imaging results that were available during my care of the patient were reviewed by me and considered in my medical decision making (see chart for details).  39 year old female who presents approximately 17 days s/p elective abortion for vaginal bleeding, pelvic pain  and foul discharge. Will obtain transvaginal ultrasound to evaluate retained products of conception, check CBC, beta hCG, blood type and Rh, analgesia, and reassess.  ----------------------------------------- 7:05 AM on 08/12/2014 -----------------------------------------  Patient resting in no acute distress. Updated patient and family member of laboratory and radiology results. Will place patient on Flagyl and she will follow-up with her GYN doctor. Strict return precautions given. Both verbalize understanding and agree with plan of care. ____________________________________________   FINAL CLINICAL IMPRESSION(S) / ED DIAGNOSES  Final diagnoses:  Pelvic pain in female  Bacterial vaginosis      Irean Hong, MD 08/12/14 [phone]

## 2014-08-12 NOTE — Discharge Instructions (Signed)
1. Take antibiotic as prescribed (Flagyl 500 mg twice daily 7 days). 2. Return to the ER for worsening symptoms, fever, persistent vomiting or other concerns.  Bacterial Vaginosis Bacterial vaginosis is a vaginal infection that occurs when the normal balance of bacteria in the vagina is disrupted. It results from an overgrowth of certain bacteria. This is the most common vaginal infection in women of childbearing age. Treatment is important to prevent complications, especially in pregnant women, as it can cause a premature delivery. CAUSES  Bacterial vaginosis is caused by an increase in harmful bacteria that are normally present in smaller amounts in the vagina. Several different kinds of bacteria can cause bacterial vaginosis. However, the reason that the condition develops is not fully understood. RISK FACTORS Certain activities or behaviors can put you at an increased risk of developing bacterial vaginosis, including:  Having a new sex partner or multiple sex partners.  Douching.  Using an intrauterine device (IUD) for contraception. Women do not get bacterial vaginosis from toilet seats, bedding, swimming pools, or contact with objects around them. SIGNS AND SYMPTOMS  Some women with bacterial vaginosis have no signs or symptoms. Common symptoms include:  Grey vaginal discharge.  A fishlike odor with discharge, especially after sexual intercourse.  Itching or burning of the vagina and vulva.  Burning or pain with urination. DIAGNOSIS  Your health care provider will take a medical history and examine the vagina for signs of bacterial vaginosis. A sample of vaginal fluid may be taken. Your health care provider will look at this sample under a microscope to check for bacteria and abnormal cells. A vaginal pH test may also be done.  TREATMENT  Bacterial vaginosis may be treated with antibiotic medicines. These may be given in the form of a pill or a vaginal cream. A second round of  antibiotics may be prescribed if the condition comes back after treatment.  HOME CARE INSTRUCTIONS   Only take over-the-counter or prescription medicines as directed by your health care provider.  If antibiotic medicine was prescribed, take it as directed. Make sure you finish it even if you start to feel better.  Do not have sex until treatment is completed.  Tell all sexual partners that you have a vaginal infection. They should see their health care provider and be treated if they have problems, such as a mild rash or itching.  Practice safe sex by using condoms and only having one sex partner. SEEK MEDICAL CARE IF:   Your symptoms are not improving after 3 days of treatment.  You have increased discharge or pain.  You have a fever. MAKE SURE YOU:   Understand these instructions.  Will watch your condition.  Will get help right away if you are not doing well or get worse. FOR MORE INFORMATION  Centers for Disease Control and Prevention, Division of STD Prevention: [employer]Apps[employer] American Sexual Health Association (ASHA): www.ashastd.org  Document Released: 02/01/2005 Document Revised: 11/22/2012 Document Reviewed: 09/13/2012 [facility] Patient Information 2015 [place], [place]. This information is not intended to replace advice given to you by your health care provider. Make sure you discuss any questions you have with your health care provider.  Pelvic Pain Female pelvic pain can be caused by many different things and start from a variety of places. Pelvic pain refers to pain that is located in the lower half of the abdomen and between your hips. The pain may occur over a short period of time (acute) or may be reoccurring (chronic). The cause of  pelvic pain may be related to disorders affecting the female reproductive organs (gynecologic), but it may also be related to the bladder, kidney stones, an intestinal complication, or muscle or skeletal problems. Getting help right away  for pelvic pain is important, especially if there has been severe, sharp, or a sudden onset of unusual pain. It is also important to get help right away because some types of pelvic pain can be life threatening.  CAUSES  Below are only some of the causes of pelvic pain. The causes of pelvic pain can be in one of several categories.   Gynecologic.  Pelvic inflammatory disease.  Sexually transmitted infection.  Ovarian cyst or a twisted ovarian ligament (ovarian torsion).  Uterine lining that grows outside the uterus (endometriosis).  Fibroids, cysts, or tumors.  Ovulation.  Pregnancy.  Pregnancy that occurs outside the uterus (ectopic pregnancy).  Miscarriage.  Labor.  Abruption of the placenta or ruptured uterus.  Infection.  Uterine infection (endometritis).  Bladder infection.  Diverticulitis.  Miscarriage related to a uterine infection (septic abortion).  Bladder.  Inflammation of the bladder (cystitis).  Kidney stone(s).  Gastrointestinal.  Constipation.  Diverticulitis.  Neurologic.  Trauma.  Feeling pelvic pain because of mental or emotional causes (psychosomatic).  Cancers of the bowel or pelvis. EVALUATION  Your caregiver will want to take a careful history of your concerns. This includes recent changes in your health, a careful gynecologic history of your periods (menses), and a sexual history. Obtaining your family history and medical history is also important. Your caregiver may suggest a pelvic exam. A pelvic exam will help identify the location and severity of the pain. It also helps in the evaluation of which organ system may be involved. In order to identify the cause of the pelvic pain and be properly treated, your caregiver may order tests. These tests may include:   A pregnancy test.  Pelvic ultrasonography.  An X-ray exam of the abdomen.  A urinalysis or evaluation of vaginal discharge.  Blood tests. HOME CARE INSTRUCTIONS    Only take over-the-counter or prescription medicines for pain, discomfort, or fever as directed by your caregiver.   Rest as directed by your caregiver.   Eat a balanced diet.   Drink enough fluids to make your urine clear or pale yellow, or as directed.   Avoid sexual intercourse if it causes pain.   Apply warm or cold compresses to the lower abdomen depending on which one helps the pain.   Avoid stressful situations.   Keep a journal of your pelvic pain. Write down when it started, where the pain is located, and if there are things that seem to be associated with the pain, such as food or your menstrual cycle.  Follow up with your caregiver as directed.  SEEK MEDICAL CARE IF:  Your medicine does not help your pain.  You have abnormal vaginal discharge. SEEK IMMEDIATE MEDICAL CARE IF:   You have heavy bleeding from the vagina.   Your pelvic pain increases.   You feel light-headed or faint.   You have chills.   You have pain with urination or blood in your urine.   You have uncontrolled diarrhea or vomiting.   You have a fever or persistent symptoms for more than 3 days.  You have a fever and your symptoms suddenly get worse.   You are being physically or sexually abused.  MAKE SURE YOU:  Understand these instructions.  Will watch your condition.  Will get help if you are  not doing well or get worse. Document Released: 12/30/2003 Document Revised: 06/18/2013 Document Reviewed: 05/24/2011 [facility] Patient Information 2015 [place], [place]. This information is not intended to replace advice given to you by your health care provider. Make sure you discuss any questions you have with your health care provider.

## 2014-08-12 NOTE — ED Notes (Signed)
Pt had elective abortion 07/26/14. Pt c/o vaginal bleeding that has persisted since the abortion. Pt c/o pelvic pain that is not resolved w/ OTC meds. Pt states there is an odor to her bleeding.

## 2014-08-12 NOTE — ED Notes (Signed)
Pt. Had elective abortion on the 10th of this month.  Pt. States discharging large/small clots from day after abortion.  Pt. States last large clot was this past Thursday.   Pt. Is in no pain at this time.

## 2014-08-27 ENCOUNTER — Inpatient Hospital Stay: Payer: 59 | Attending: Internal Medicine | Admitting: Internal Medicine

## 2015-08-28 ENCOUNTER — Emergency Department
Admission: EM | Admit: 2015-08-28 | Discharge: 2015-08-28 | Payer: Medicaid Other | Attending: Emergency Medicine | Admitting: Emergency Medicine

## 2015-08-28 ENCOUNTER — Encounter: Payer: Self-pay | Admitting: Emergency Medicine

## 2015-08-28 DIAGNOSIS — S0101XA Laceration without foreign body of scalp, initial encounter: Secondary | ICD-10-CM | POA: Diagnosis not present

## 2015-08-28 DIAGNOSIS — Y92410 Unspecified street and highway as the place of occurrence of the external cause: Secondary | ICD-10-CM | POA: Diagnosis not present

## 2015-08-28 DIAGNOSIS — Z041 Encounter for examination and observation following transport accident: Secondary | ICD-10-CM | POA: Diagnosis present

## 2015-08-28 DIAGNOSIS — Y999 Unspecified external cause status: Secondary | ICD-10-CM | POA: Insufficient documentation

## 2015-08-28 DIAGNOSIS — Y939 Activity, unspecified: Secondary | ICD-10-CM | POA: Insufficient documentation

## 2015-08-28 DIAGNOSIS — F17210 Nicotine dependence, cigarettes, uncomplicated: Secondary | ICD-10-CM | POA: Insufficient documentation

## 2015-08-28 LAB — URINALYSIS COMPLETE WITH MICROSCOPIC (ARMC ONLY)
Bacteria, UA: NONE SEEN
Glucose, UA: NEGATIVE mg/dL
Leukocytes, UA: NEGATIVE
NITRITE: NEGATIVE
PROTEIN: 100 mg/dL — AB
SPECIFIC GRAVITY, URINE: 1.04 — AB (ref 1.005–1.030)
pH: 5 (ref 5.0–8.0)

## 2015-08-28 LAB — CBC WITH DIFFERENTIAL/PLATELET
BASOS ABS: 0 10*3/uL (ref 0–0.1)
Basophils Relative: 0 %
EOS ABS: 0.1 10*3/uL (ref 0–0.7)
EOS PCT: 1 %
HCT: 41.5 % (ref 35.0–47.0)
Hemoglobin: 14.1 g/dL (ref 12.0–16.0)
Lymphocytes Relative: 29 %
Lymphs Abs: 3.3 10*3/uL (ref 1.0–3.6)
MCH: 30.6 pg (ref 26.0–34.0)
MCHC: 34.1 g/dL (ref 32.0–36.0)
MCV: 89.8 fL (ref 80.0–100.0)
Monocytes Absolute: 0.8 10*3/uL (ref 0.2–0.9)
Monocytes Relative: 7 %
Neutro Abs: 7 10*3/uL — ABNORMAL HIGH (ref 1.4–6.5)
Neutrophils Relative %: 63 %
PLATELETS: 289 10*3/uL (ref 150–440)
RBC: 4.62 MIL/uL (ref 3.80–5.20)
RDW: 12.8 % (ref 11.5–14.5)
WBC: 11.4 10*3/uL — AB (ref 3.6–11.0)

## 2015-08-28 LAB — COMPREHENSIVE METABOLIC PANEL
ALK PHOS: 82 U/L (ref 38–126)
ALT: 15 U/L (ref 14–54)
AST: 22 U/L (ref 15–41)
Albumin: 4.7 g/dL (ref 3.5–5.0)
Anion gap: 8 (ref 5–15)
BUN: 14 mg/dL (ref 6–20)
CALCIUM: 9.4 mg/dL (ref 8.9–10.3)
CO2: 25 mmol/L (ref 22–32)
CREATININE: 0.65 mg/dL (ref 0.44–1.00)
Chloride: 108 mmol/L (ref 101–111)
GFR calc non Af Amer: >60 mL/min (ref >60)
Glucose, Bld: 109 mg/dL — ABNORMAL HIGH (ref 65–99)
Potassium: 3.5 mmol/L (ref 3.5–5.1)
Sodium: 141 mmol/L (ref 135–145)
Total Bilirubin: 0.7 mg/dL (ref 0.3–1.2)
Total Protein: 8 g/dL (ref 6.5–8.1)

## 2015-08-28 LAB — URINE DRUG SCREEN, QUALITATIVE (ARMC ONLY)
AMPHETAMINES, UR SCREEN: POSITIVE — AB
BENZODIAZEPINE, UR SCRN: POSITIVE — AB
Barbiturates, Ur Screen: NOT DETECTED
CANNABINOID 50 NG, UR ~~LOC~~: NOT DETECTED
Cocaine Metabolite,Ur ~~LOC~~: POSITIVE — AB
MDMA (ECSTASY) UR SCREEN: NOT DETECTED
Methadone Scn, Ur: NOT DETECTED
Opiate, Ur Screen: POSITIVE — AB
Phencyclidine (PCP) Ur S: NOT DETECTED
TRICYCLIC, UR SCREEN: NOT DETECTED

## 2015-08-28 LAB — ETHANOL

## 2015-08-28 LAB — POCT PREGNANCY, URINE: Preg Test, Ur: NEGATIVE

## 2015-08-28 MED ORDER — TETANUS-DIPHTH-ACELL PERTUSSIS 5-2.5-18.5 LF-MCG/0.5 IM SUSP
0.5000 mL | Freq: Once | INTRAMUSCULAR | Status: AC
Start: 2015-08-28 — End: 2015-08-28
  Administered 2015-08-28: 0.5 mL via INTRAMUSCULAR
  Filled 2015-08-28: qty 0.5

## 2015-08-28 NOTE — ED Notes (Signed)
Pt medically cleared.  Discharge instructions reviewed.  Pt voiced understanding.  Pt left with Officer Logan Bores[place] [employer].   Officer took patient's paperwork and placed with pt's belongings.
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
## 2015-10-14 IMAGING — US US OB DETAIL+14 WK
1 series · 12 of 28 positions shown · non-contrast
Comparison: none

[Series 1: us ob detail+14 wk · 0.26mm/px · 12 of 97 slices shown]
[im 4/97]
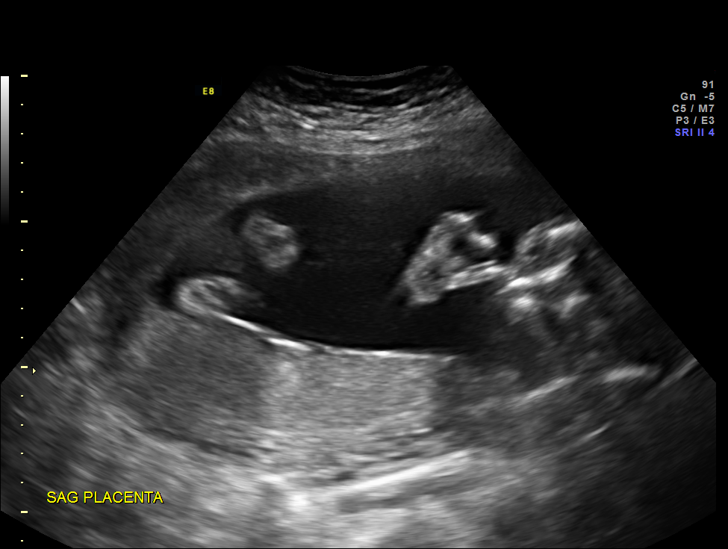
[im 11/97]
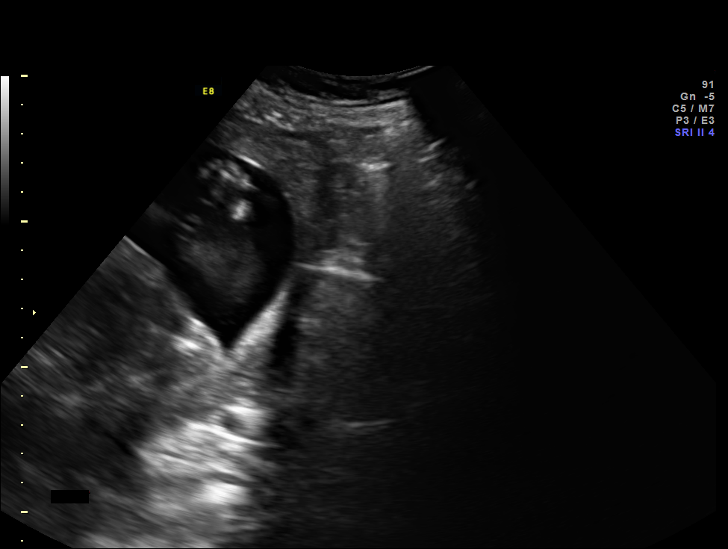
[im 18/97]
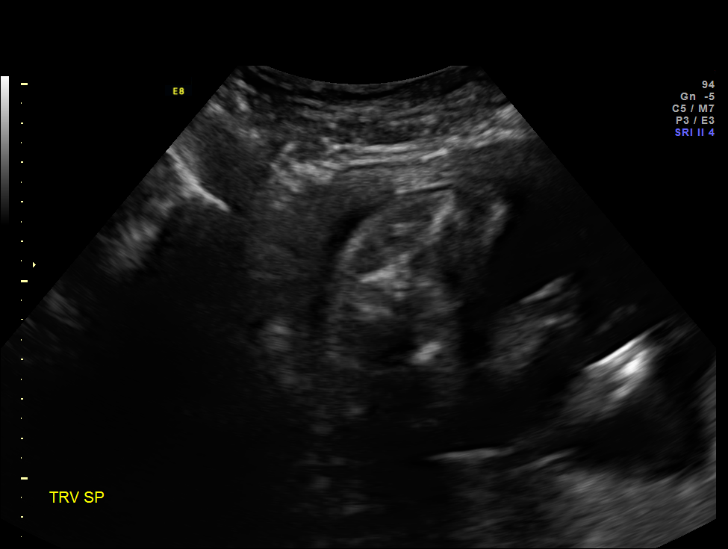
[im 29/97]
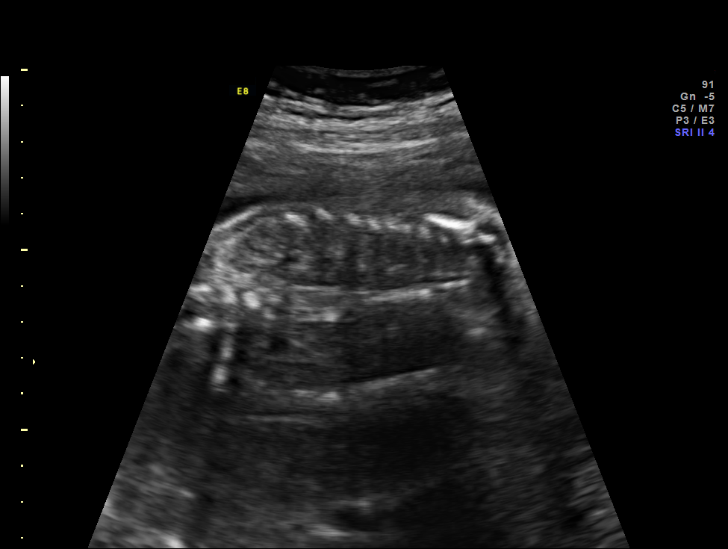
[im 36/97]
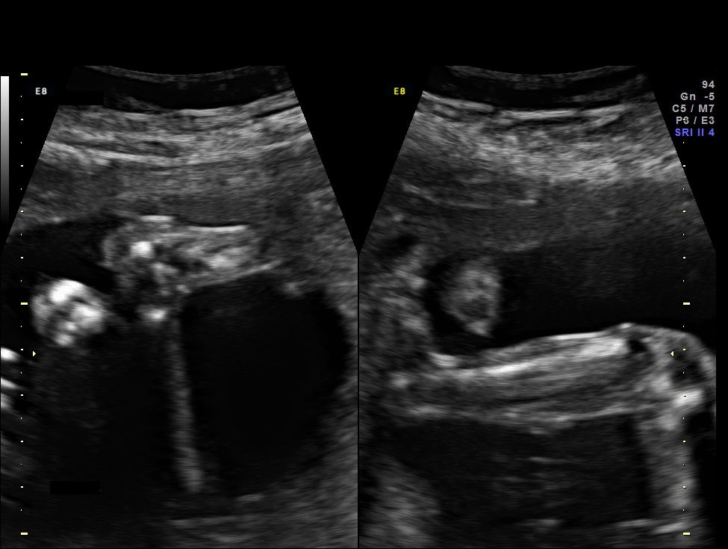
[im 43/97]
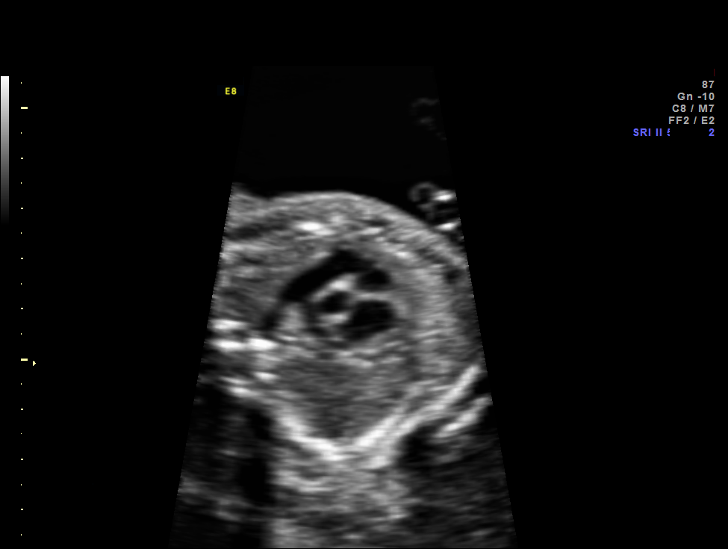
[im 54/97]
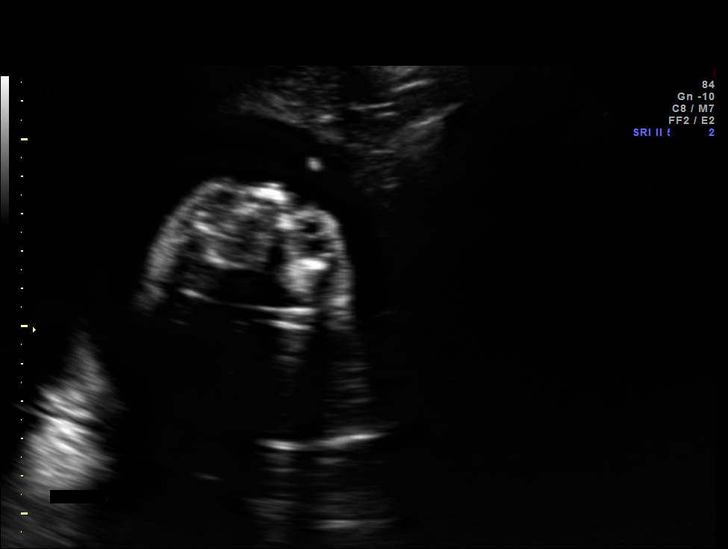
[im 61/97]
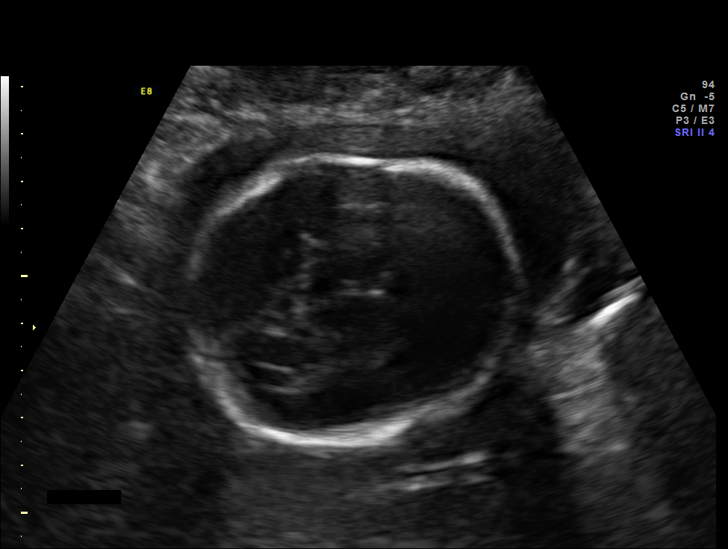
[im 68/97]
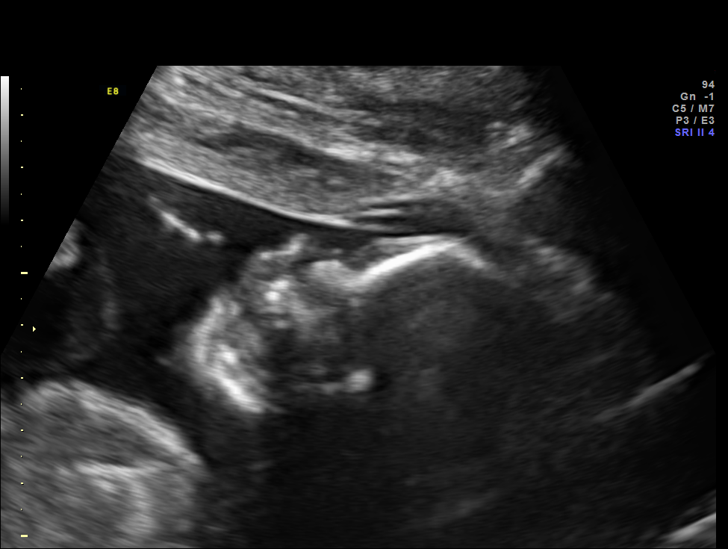
[im 79/97]
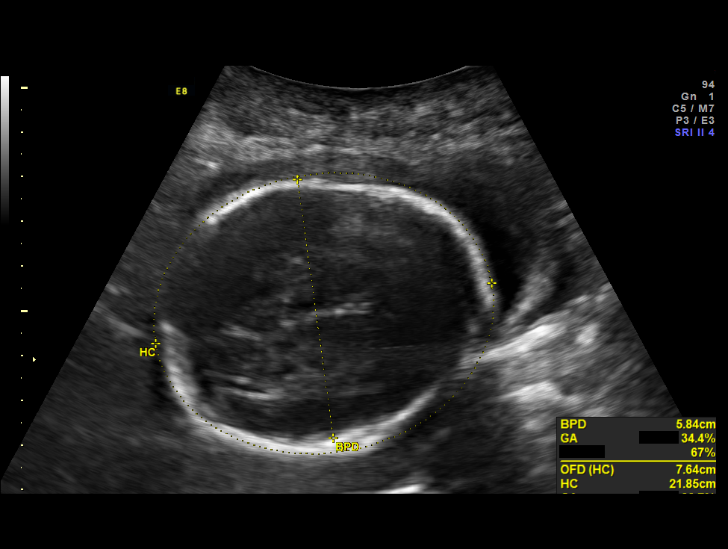
[im 86/97]
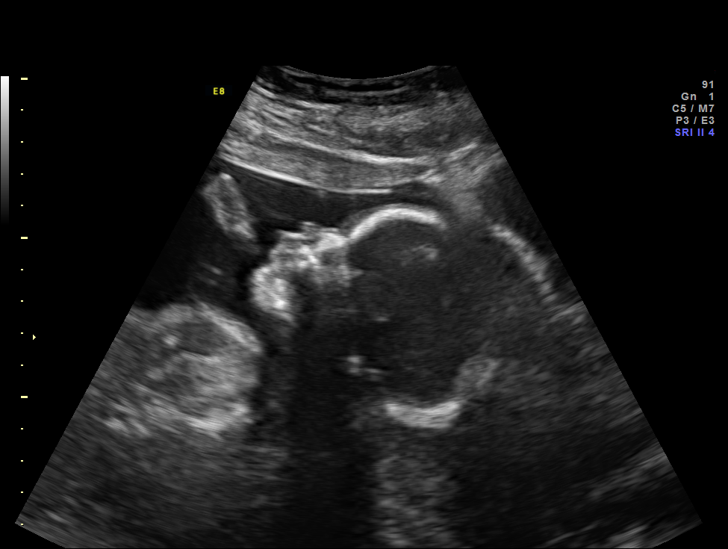
[im 93/97]
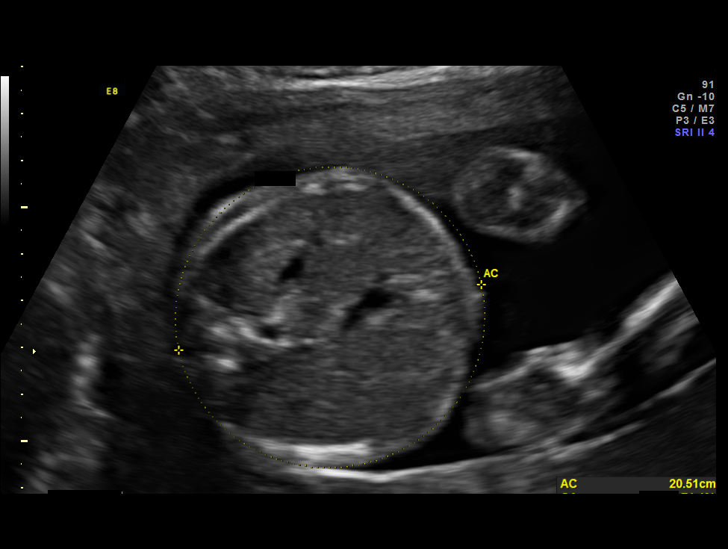

[12 of 28 positions shown; findings below may reference images not displayed]

OBSTETRICS REPORT
                      (Signed Final 02/18/2014 [DATE])

Service(s) Provided

 US OB DETAIL + 14 WK                                  76811.0
Indications

 24 weeks gestation of pregnancy
 Poor obstetric history: Previous preterm delivery x
 2 (21 week Twins and 36 weeks)
 Poor obstetric history: Previous fetal growth
 restriction (FGR)
 Previous cesarean section x 3
 Advanced maternal age multigravida 38, second
 trimester
 Herpes simplex virus (SIMPSON)
 Cigarette smoker
 Previous cervical surgery (LEEP)
 Cervical shortening, 2nd
 Opioid dependence
 Methadone use
 Rubella non-immune
 Detailed fetal anatomic survey                        Z36
Fetal Evaluation

 Num Of Fetuses:    1
 Fetal Heart Rate:  138                          bpm
 Cardiac Activity:  Observed
 Presentation:      Cephalic
 Placenta:          Posterior, above cervical
                    os
 P. Cord            Visualized, central
 Insertion:

 Amniotic Fluid
 AFI FV:      Subjectively within normal limits
                                             Larg Pckt:    5.52  cm
Biometry

 BPD:     58.7  mm     G. Age:  24w 0d                CI:        72.77   70 - 86
                                                      FL/HC:      18.0   18.7 -

 HC:     218.8  mm     G. Age:  23w 6d       24  %    HC/AC:      1.08   1.05 -

 AC:     203.2  mm     G. Age:  24w 6d       66  %    FL/BPD:     67.0   71 - 87
 FL:      39.3  mm     G. Age:  22w 4d        6  %    FL/AC:      19.3   20 - 24
 HUM:     37.7  mm     G. Age:  23w 2d       21  %
 Est. FW:     645  gm      1 lb 7 oz     48  %
Gestational Age

 Clinical EDD:  24w 1d                                        EDD:   06/09/14
 U/S Today:     23w 6d                                        EDD:   06/11/14
 Best:          24w 1d     Det. By:  Clinical EDD             EDD:   06/09/14
Anatomy

 Cranium:          Appears normal         Aortic Arch:      Previously seen
 Fetal Cavum:      Appears normal         Ductal Arch:      Appears normal
 Ventricles:       Appears normal         Diaphragm:        Appears normal
 Choroid Plexus:   Appears normal         Stomach:          Appears normal, left
                                                            sided
 Cerebellum:       Appears normal         Abdomen:          Appears normal
 Posterior Fossa:  Appears normal         Abdominal Wall:   Appears nml (cord
                                                            insert, abd wall)
 Nuchal Fold:      Not applicable (>20    Cord Vessels:     Appears normal (3
                   wks GA)                                  vessel cord)
 Face:             Appears normal         Kidneys:          Appear normal
                   (orbits and profile)
 Lips:             Appears normal         Bladder:          Appears normal
 Heart:            Appears normal         Spine:            Appears normal
                   (4CH, axis, and
                   situs)
 RVOT:             Appears normal         Lower             Appears normal
                                          Extremities:
 LVOT:             Appears normal         Upper             Appears normal
                                          Extremities:

 Other:  Fetus appears to be a male. Heels and 5th digit visualized. Nasal
         bone visualized.
Cervix Uterus Adnexa

 Cervix:       Evaluated on earlier scan today.
 Uterus:       No abnormality visualized.
 Cul De Sac:   No free fluid seen.

 Left Ovary:    Not visualized.
 Right Ovary:   Within normal limits.

 Adnexa:     No abnormality visualized.
Impression

 Single IUP at 24w 1d
 Advanced cervical dilation
 Normal fetal anatomic survey
 The estimated fetal weight today is at the 48th %tile.
 Posterior placenta without previa
 Normal amniotic fluid volume
Recommendations

 Recommend follow-up ultrasound examination in 3-4 weeks
 for interval growth or as clinically indicated.

## 2016-02-17 IMAGING — US US PELVIS COMPLETE
1 series · 14 of 25 positions shown · non-contrast
Comparison: None

CLINICAL DATA: Lower abdominal pain and cramping for 4 days.



[Series 1: us pelvis complete · 0.24mm/px · 14 of 92 slices shown]
[im 1/92]
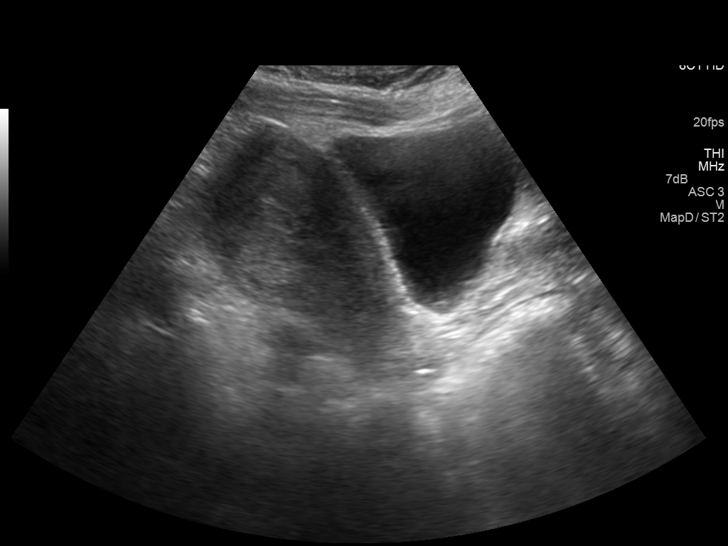
[im 8/92]
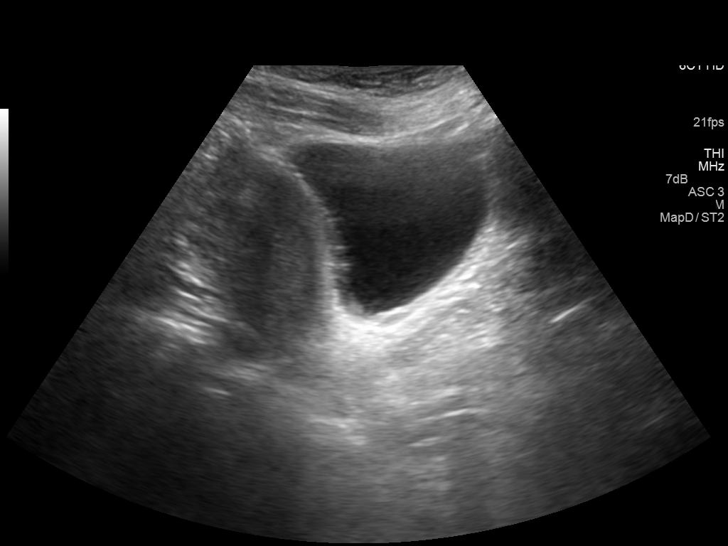
[im 16/92]
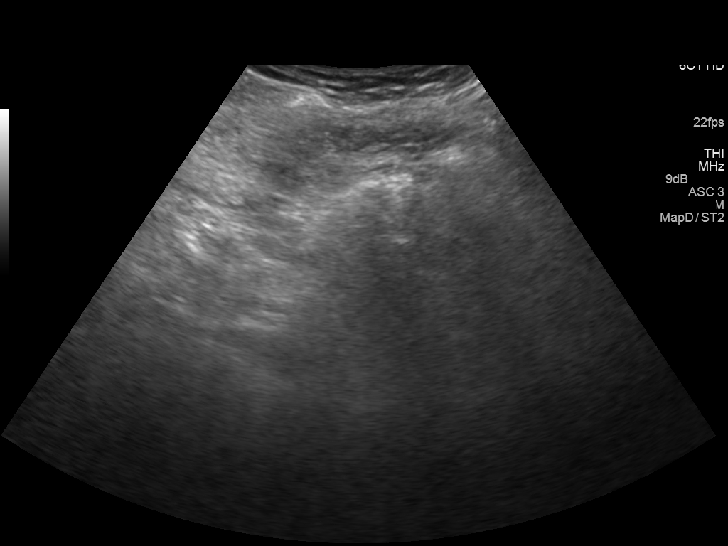
[im 23/92]
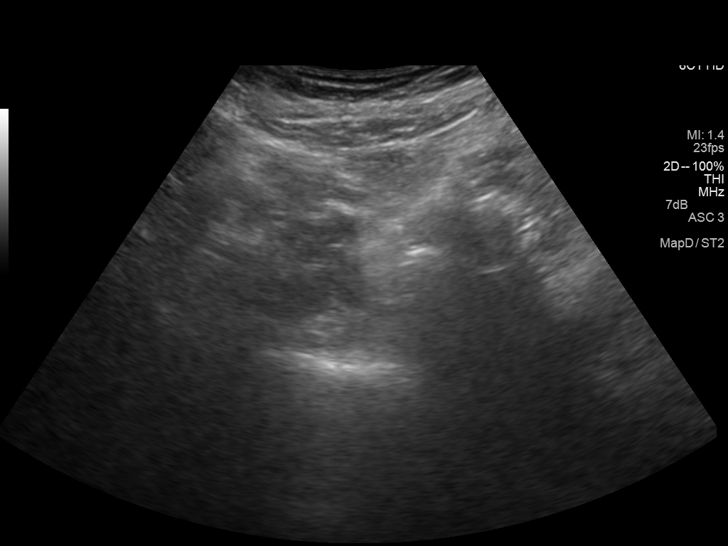
[im 31/92]
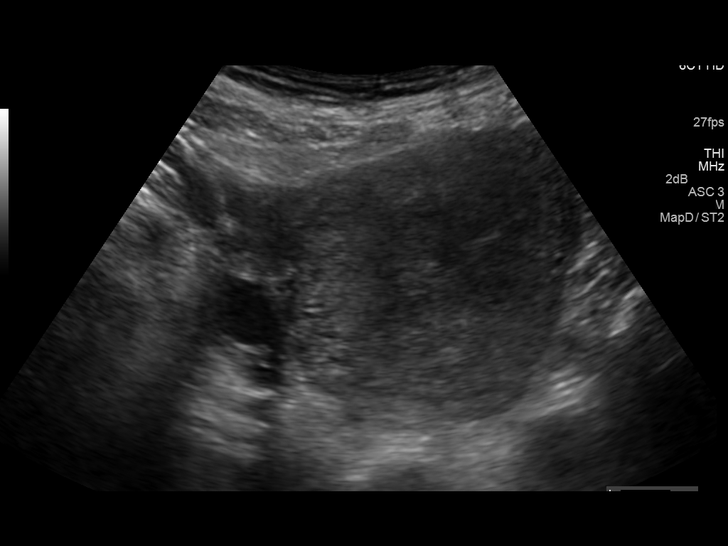
[im 35/92]
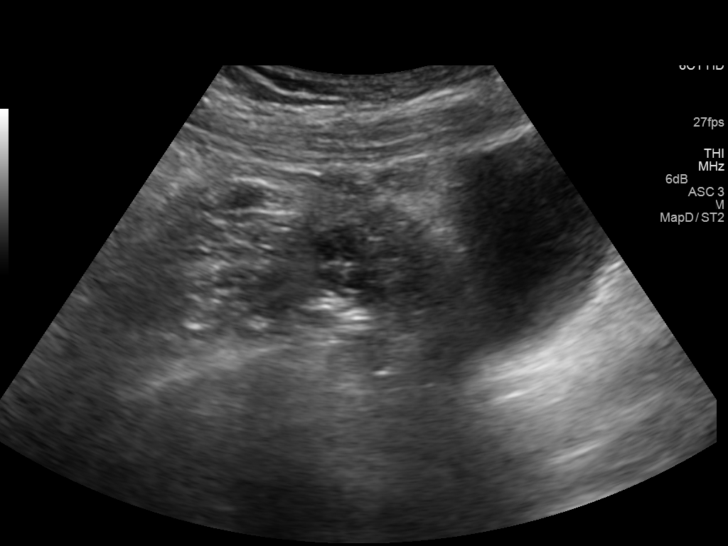
[im 42/92]
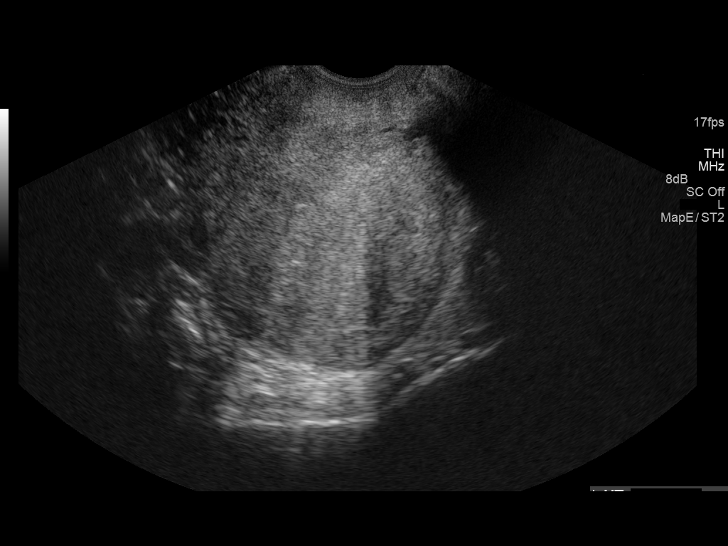
[im 50/92]
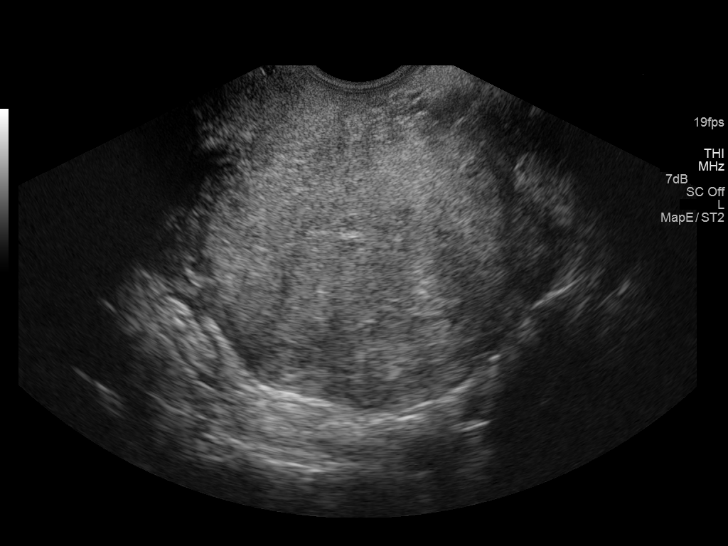
[im 57/92]
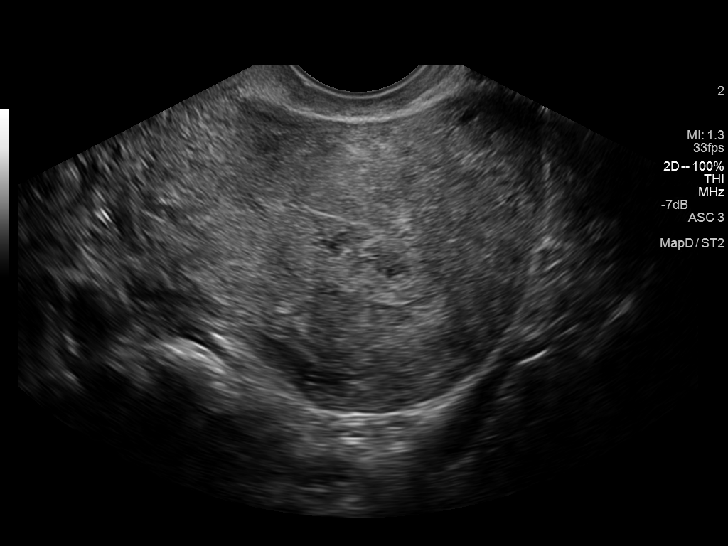
[im 61/92]
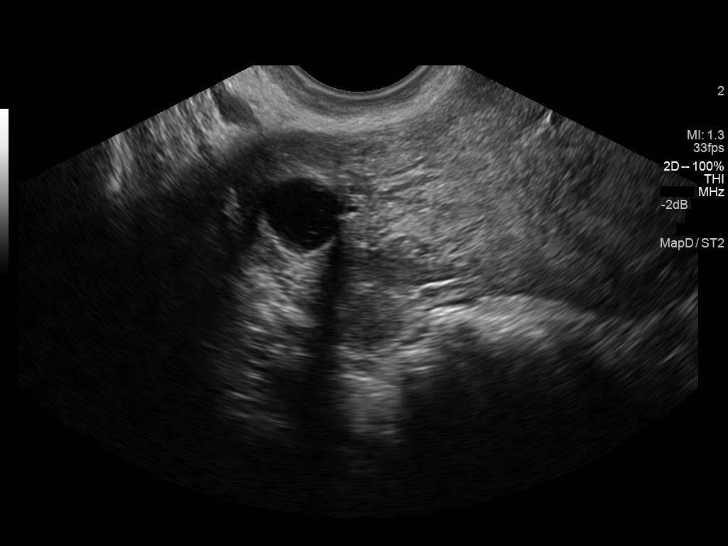
[im 69/92]
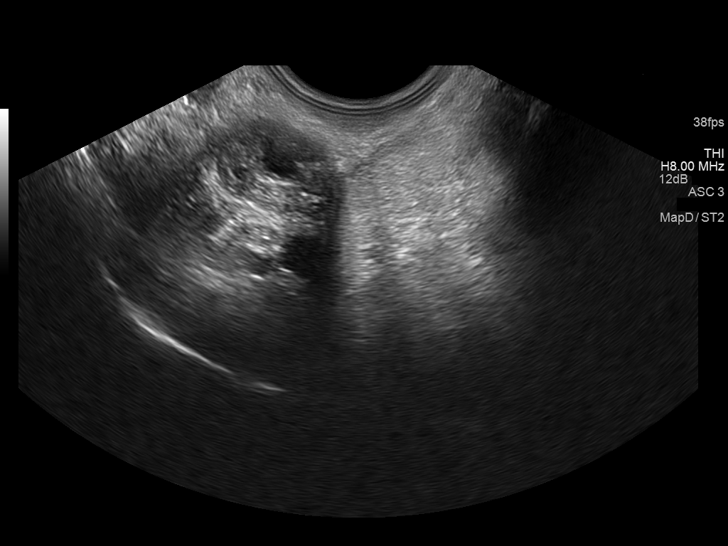
[im 76/92]
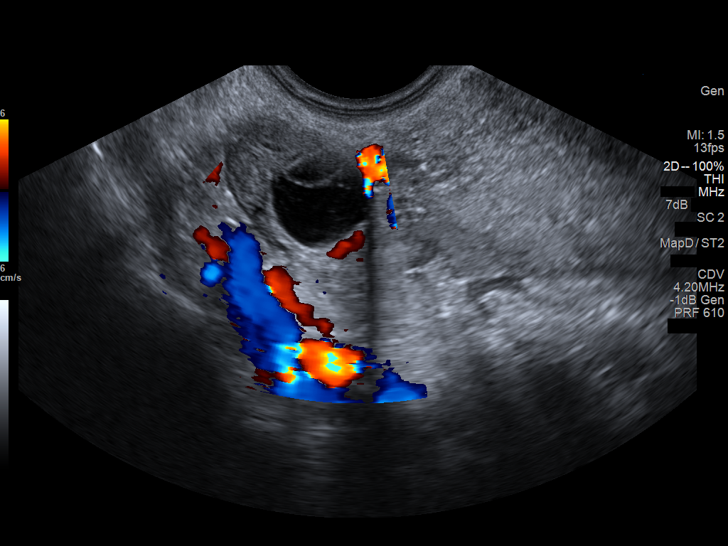
[im 84/92]
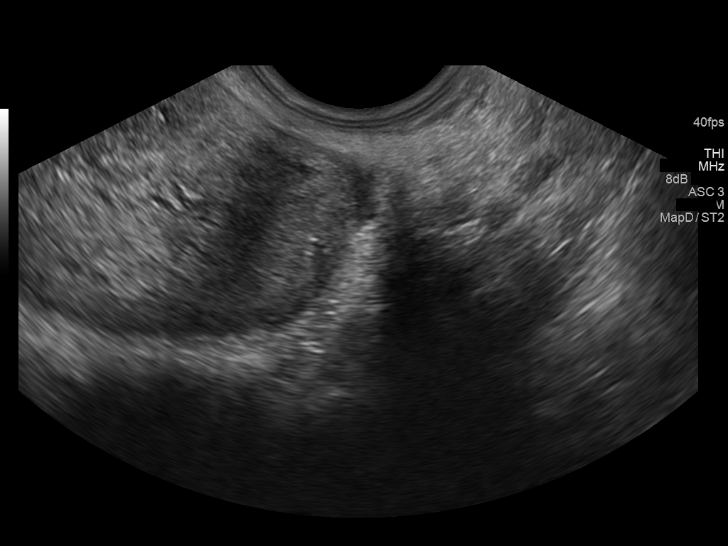
[im 92/92]
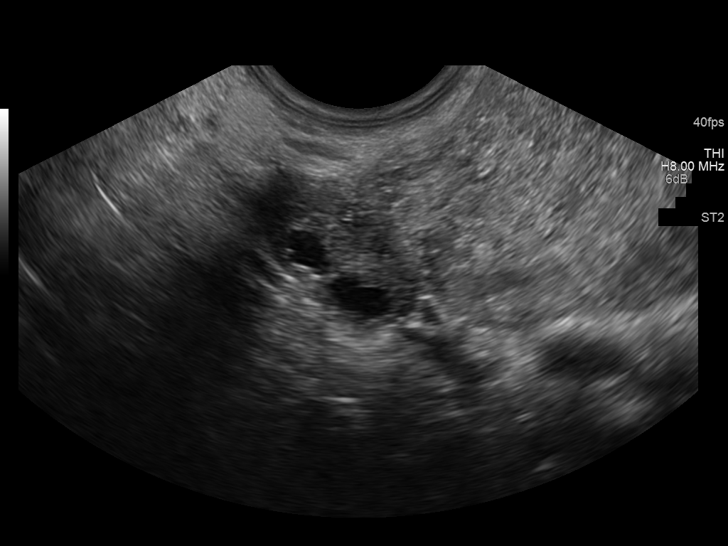

[14 of 25 positions shown; findings below may reference images not displayed]

FINDINGS: Uterus

Measurements: 9.9 x 5.4 x 7.1 cm. No fibroids or other mass
visualized.

Endometrium

Thickness: 11.4 mm. The endometrium is mildly inhomogeneous. No
evidence of retained products of conception.

Right ovary

Measurements: 3.6 x 1.9 x 1.9 cm. There is a 1.5 cm mildly complex
cyst with a thin septation.

Left ovary

Measurements: 2.9 x 1.2 x 1.1 cm. Normal appearance/no adnexal mass.

Other findings

There is a trace free pelvic fluid.
IMPRESSION: No significant abnormality.

## 2016-11-07 ENCOUNTER — Encounter
Admission: RE | Disposition: A | Discharge status: Home / Self Care | Payer: Self-pay | Source: Ambulatory Visit | Attending: Obstetrics and Gynecology

## 2016-11-07 ENCOUNTER — Inpatient Hospital Stay
Admission: RE | Admit: 2016-11-07 | Discharge: 2016-11-10 | DRG: 765 | Disposition: A | Discharge status: Home / Self Care | Payer: Medicaid Other | Source: Ambulatory Visit | Attending: Obstetrics and Gynecology | Admitting: Obstetrics and Gynecology

## 2016-11-07 ENCOUNTER — Inpatient Hospital Stay: Payer: Medicaid Other | Admitting: Anesthesiology

## 2016-11-07 ENCOUNTER — Encounter: Payer: Self-pay | Admitting: *Deleted

## 2016-11-07 DIAGNOSIS — Z302 Encounter for sterilization: Secondary | ICD-10-CM | POA: Diagnosis not present

## 2016-11-07 DIAGNOSIS — Z3A35 35 weeks gestation of pregnancy: Secondary | ICD-10-CM

## 2016-11-07 DIAGNOSIS — F17210 Nicotine dependence, cigarettes, uncomplicated: Secondary | ICD-10-CM | POA: Diagnosis present

## 2016-11-07 DIAGNOSIS — Z880 Allergy status to penicillin: Secondary | ICD-10-CM

## 2016-11-07 DIAGNOSIS — F1490 Cocaine use, unspecified, uncomplicated: Secondary | ICD-10-CM | POA: Diagnosis present

## 2016-11-07 DIAGNOSIS — O99334 Smoking (tobacco) complicating childbirth: Secondary | ICD-10-CM | POA: Diagnosis present

## 2016-11-07 DIAGNOSIS — O34211 Maternal care for low transverse scar from previous cesarean delivery: Secondary | ICD-10-CM | POA: Diagnosis present

## 2016-11-07 DIAGNOSIS — O9081 Anemia of the puerperium: Secondary | ICD-10-CM | POA: Diagnosis not present

## 2016-11-07 DIAGNOSIS — O99324 Drug use complicating childbirth: Secondary | ICD-10-CM | POA: Diagnosis present

## 2016-11-07 DIAGNOSIS — F1120 Opioid dependence, uncomplicated: Secondary | ICD-10-CM | POA: Diagnosis present

## 2016-11-07 DIAGNOSIS — A6000 Herpesviral infection of urogenital system, unspecified: Secondary | ICD-10-CM | POA: Diagnosis present

## 2016-11-07 DIAGNOSIS — O42913 Preterm premature rupture of membranes, unspecified as to length of time between rupture and onset of labor, third trimester: Secondary | ICD-10-CM | POA: Diagnosis present

## 2016-11-07 DIAGNOSIS — O26873 Cervical shortening, third trimester: Secondary | ICD-10-CM | POA: Diagnosis present

## 2016-11-07 DIAGNOSIS — D62 Acute posthemorrhagic anemia: Secondary | ICD-10-CM | POA: Diagnosis not present

## 2016-11-07 DIAGNOSIS — O9832 Other infections with a predominantly sexual mode of transmission complicating childbirth: Secondary | ICD-10-CM | POA: Diagnosis present

## 2016-11-07 HISTORY — DX: Unspecified asthma, uncomplicated: J45.909

## 2016-11-07 HISTORY — DX: Atherosclerotic heart disease of native coronary artery without angina pectoris: I25.10

## 2016-11-07 LAB — URINE DRUG SCREEN, QUALITATIVE (ARMC ONLY)
Amphetamines, Ur Screen: NOT DETECTED
Barbiturates, Ur Screen: NOT DETECTED
Benzodiazepine, Ur Scrn: POSITIVE — AB
CANNABINOID 50 NG, UR ~~LOC~~: NOT DETECTED
COCAINE METABOLITE, UR ~~LOC~~: NOT DETECTED
MDMA (Ecstasy)Ur Screen: NOT DETECTED
Methadone Scn, Ur: NOT DETECTED
OPIATE, UR SCREEN: NOT DETECTED
Phencyclidine (PCP) Ur S: NOT DETECTED
Tricyclic, Ur Screen: NOT DETECTED

## 2016-11-07 LAB — CBC
HCT: 29.8 % — ABNORMAL LOW (ref 35.0–47.0)
Hemoglobin: 10.6 g/dL — ABNORMAL LOW (ref 12.0–16.0)
MCH: 31.3 pg (ref 26.0–34.0)
MCHC: 35.6 g/dL (ref 32.0–36.0)
MCV: 87.9 fL (ref 80.0–100.0)
PLATELETS: 291 10*3/uL (ref 150–440)
RBC: 3.39 MIL/uL — ABNORMAL LOW (ref 3.80–5.20)
RDW: 12.6 % (ref 11.5–14.5)
WBC: 8 10*3/uL (ref 3.6–11.0)

## 2016-11-07 LAB — TYPE AND SCREEN
ABO/RH(D): O POS
Antibody Screen: NEGATIVE

## 2016-11-07 LAB — CHLAMYDIA/NGC RT PCR (ARMC ONLY)
Chlamydia Tr: NOT DETECTED
N gonorrhoeae: NOT DETECTED

## 2016-11-07 SURGERY — Surgical Case
Anesthesia: Spinal | Wound class: Clean Contaminated

## 2016-11-07 MED ORDER — SOD CITRATE-CITRIC ACID 500-334 MG/5ML PO SOLN
ORAL | Status: AC
  Administered 2016-11-07: 5 mL via ORAL
  Filled 2016-11-07: qty 15

## 2016-11-07 MED ORDER — NALOXONE HCL 0.4 MG/ML IJ SOLN: 0.4000 mg | INTRAMUSCULAR | Status: DC | PRN

## 2016-11-07 MED ORDER — DIPHENHYDRAMINE HCL 25 MG PO CAPS: 25.0000 mg | ORAL_CAPSULE | Freq: Four times a day (QID) | ORAL | Status: DC | PRN

## 2016-11-07 MED ORDER — SIMETHICONE 80 MG PO CHEW: 80.0000 mg | CHEWABLE_TABLET | ORAL | Status: DC | PRN

## 2016-11-07 MED ORDER — IBUPROFEN 800 MG PO TABS
800.0000 mg | ORAL_TABLET | Freq: Three times a day (TID) | ORAL | Status: DC
  Administered 2016-11-07 – 2016-11-08 (×2): 800 mg via ORAL
  Filled 2016-11-07 (×2): qty 1

## 2016-11-07 MED ORDER — MENTHOL 3 MG MT LOZG
1.0000 | LOZENGE | OROMUCOSAL | Status: DC | PRN
  Filled 2016-11-07: qty 9

## 2016-11-07 MED ORDER — ACETAMINOPHEN 325 MG PO TABS: 650.0000 mg | ORAL_TABLET | ORAL | Status: DC | PRN

## 2016-11-07 MED ORDER — GABAPENTIN 300 MG PO CAPS
900.0000 mg | ORAL_CAPSULE | Freq: Every day | ORAL | Status: DC
  Filled 2016-11-07 (×2): qty 3

## 2016-11-07 MED ORDER — BETAMETHASONE SOD PHOS & ACET 6 (3-3) MG/ML IJ SUSP: 12.0000 mg | Freq: Once | INTRAMUSCULAR | Status: DC

## 2016-11-07 MED ORDER — CLINDAMYCIN PHOSPHATE 900 MG/50ML IV SOLN
900.0000 mg | Freq: Three times a day (TID) | INTRAVENOUS | Status: DC
  Administered 2016-11-07: 900 mg via INTRAVENOUS

## 2016-11-07 MED ORDER — WITCH HAZEL-GLYCERIN EX PADS: 1.0000 "application " | MEDICATED_PAD | CUTANEOUS | Status: DC | PRN

## 2016-11-07 MED ORDER — SOD CITRATE-CITRIC ACID 500-334 MG/5ML PO SOLN: 30.0000 mL | ORAL | Status: DC | PRN

## 2016-11-07 MED ORDER — PHENYLEPHRINE HCL 10 MG/ML IJ SOLN
INTRAMUSCULAR | Status: AC
  Filled 2016-11-07: qty 1

## 2016-11-07 MED ORDER — ONDANSETRON HCL 4 MG/2ML IJ SOLN: 4.0000 mg | Freq: Once | INTRAMUSCULAR | Status: DC | PRN

## 2016-11-07 MED ORDER — EPHEDRINE SULFATE 50 MG/ML IJ SOLN
INTRAMUSCULAR | Status: AC
  Filled 2016-11-07: qty 1

## 2016-11-07 MED ORDER — OXYTOCIN 40 UNITS IN LACTATED RINGERS INFUSION - SIMPLE MED: 2.5000 [IU]/h | INTRAVENOUS | Status: AC

## 2016-11-07 MED ORDER — OXYCODONE-ACETAMINOPHEN 5-325 MG PO TABS: 1.0000 | ORAL_TABLET | ORAL | Status: DC | PRN

## 2016-11-07 MED ORDER — ACETAMINOPHEN 500 MG PO TABS
1000.0000 mg | ORAL_TABLET | Freq: Four times a day (QID) | ORAL | Status: AC
  Administered 2016-11-07 – 2016-11-10 (×9): 1000 mg via ORAL
  Filled 2016-11-07 (×10): qty 2

## 2016-11-07 MED ORDER — MORPHINE SULFATE (PF) 0.5 MG/ML IJ SOLN
INTRAMUSCULAR | Status: AC
  Filled 2016-11-07: qty 10

## 2016-11-07 MED ORDER — SODIUM CHLORIDE 0.9 % IV SOLN
INTRAVENOUS | Status: AC
  Filled 2016-11-07: qty 2000

## 2016-11-07 MED ORDER — BETAMETHASONE SOD PHOS & ACET 6 (3-3) MG/ML IJ SUSP
INTRAMUSCULAR | Status: AC
  Administered 2016-11-07: 12 mg
  Filled 2016-11-07: qty 1

## 2016-11-07 MED ORDER — SODIUM CHLORIDE 0.9 % IV SOLN
INTRAVENOUS | Status: DC | PRN
  Administered 2016-11-07: 70 mL

## 2016-11-07 MED ORDER — OXYTOCIN BOLUS FROM INFUSION
500.0000 mL | Freq: Once | INTRAVENOUS | Status: AC
Start: 2016-11-07 — End: 2016-11-07
  Administered 2016-11-07: 500 mL via INTRAVENOUS

## 2016-11-07 MED ORDER — ACETAMINOPHEN 500 MG PO TABS
1000.0000 mg | ORAL_TABLET | Freq: Four times a day (QID) | ORAL | Status: DC
  Administered 2016-11-07: 1000 mg via ORAL
  Filled 2016-11-07: qty 2

## 2016-11-07 MED ORDER — CLINDAMYCIN PHOSPHATE 900 MG/50ML IV SOLN
INTRAVENOUS | Status: AC
  Administered 2016-11-07: 900 mg via INTRAVENOUS
  Filled 2016-11-07: qty 50

## 2016-11-07 MED ORDER — LACTATED RINGERS IV SOLN: INTRAVENOUS | Status: DC

## 2016-11-07 MED ORDER — SENNOSIDES-DOCUSATE SODIUM 8.6-50 MG PO TABS
2.0000 | ORAL_TABLET | ORAL | Status: DC
  Administered 2016-11-08 – 2016-11-10 (×3): 2 via ORAL
  Filled 2016-11-07 (×3): qty 2

## 2016-11-07 MED ORDER — KETOROLAC TROMETHAMINE 30 MG/ML IJ SOLN: 30.0000 mg | Freq: Four times a day (QID) | INTRAMUSCULAR | Status: DC | PRN

## 2016-11-07 MED ORDER — TERBUTALINE SULFATE 1 MG/ML IJ SOLN
INTRAMUSCULAR | Status: AC
  Administered 2016-11-07: 0.25 mg
  Filled 2016-11-07: qty 1

## 2016-11-07 MED ORDER — OXYTOCIN 40 UNITS IN LACTATED RINGERS INFUSION - SIMPLE MED
2.5000 [IU]/h | INTRAVENOUS | Status: DC
  Administered 2016-11-07: 2.5 [IU]/h via INTRAVENOUS
  Filled 2016-11-07: qty 1000

## 2016-11-07 MED ORDER — EPHEDRINE 5 MG/ML INJ
5.0000 mg | Freq: Once | INTRAVENOUS | Status: AC
  Administered 2016-11-07: 5 mg via INTRAVENOUS
  Filled 2016-11-07: qty 4

## 2016-11-07 MED ORDER — MEASLES, MUMPS & RUBELLA VAC ~~LOC~~ INJ
0.5000 mL | INJECTION | Freq: Once | SUBCUTANEOUS | Status: DC
  Filled 2016-11-07: qty 0.5

## 2016-11-07 MED ORDER — BUPIVACAINE LIPOSOME 1.3 % IJ SUSP
INTRAMUSCULAR | Status: AC
  Administered 2016-11-07: 12:00:00
  Filled 2016-11-07: qty 20

## 2016-11-07 MED ORDER — DEXTROSE 5 % IV SOLN
500.0000 mg | INTRAVENOUS | Status: DC
  Administered 2016-11-07: 500 mg via INTRAVENOUS
  Filled 2016-11-07: qty 500

## 2016-11-07 MED ORDER — IBUPROFEN 600 MG PO TABS
600.0000 mg | ORAL_TABLET | Freq: Four times a day (QID) | ORAL | Status: DC
  Administered 2016-11-07: 600 mg via ORAL
  Filled 2016-11-07: qty 1

## 2016-11-07 MED ORDER — GENTAMICIN SULFATE 40 MG/ML IJ SOLN
5.0000 mg/kg | Freq: Once | INTRAVENOUS | Status: AC
  Administered 2016-11-07: 340 mg via INTRAVENOUS
  Filled 2016-11-07: qty 8.5

## 2016-11-07 MED ORDER — LORAZEPAM 1 MG PO TABS
1.0000 mg | ORAL_TABLET | Freq: Two times a day (BID) | ORAL | Status: DC | PRN
  Administered 2016-11-07 – 2016-11-09 (×2): 1 mg via ORAL
  Filled 2016-11-07 (×2): qty 1

## 2016-11-07 MED ORDER — SODIUM CHLORIDE 0.9% FLUSH: 3.0000 mL | INTRAVENOUS | Status: DC | PRN

## 2016-11-07 MED ORDER — PHENYLEPHRINE HCL 10 MG/ML IJ SOLN
INTRAMUSCULAR | Status: DC | PRN
  Administered 2016-11-07 (×3): 100 ug via INTRAVENOUS

## 2016-11-07 MED ORDER — BUPIVACAINE HCL (PF) 0.25 % IJ SOLN
INTRAMUSCULAR | Status: DC | PRN
  Administered 2016-11-07: 30 mL

## 2016-11-07 MED ORDER — BUPIVACAINE IN DEXTROSE 0.75-8.25 % IT SOLN
INTRATHECAL | Status: DC | PRN
  Administered 2016-11-07: 1.5 mL via INTRATHECAL

## 2016-11-07 MED ORDER — CLINDAMYCIN PHOSPHATE 900 MG/50ML IV SOLN: 900.0000 mg | INTRAVENOUS | Status: DC

## 2016-11-07 MED ORDER — DIPHENHYDRAMINE HCL 25 MG PO CAPS: 25.0000 mg | ORAL_CAPSULE | ORAL | Status: DC | PRN

## 2016-11-07 MED ORDER — OXYCODONE HCL 5 MG PO TABS: 5.0000 mg | ORAL_TABLET | ORAL | Status: DC | PRN

## 2016-11-07 MED ORDER — DIBUCAINE 1 % RE OINT: 1.0000 "application " | TOPICAL_OINTMENT | RECTAL | Status: DC | PRN

## 2016-11-07 MED ORDER — ONDANSETRON HCL 4 MG/2ML IJ SOLN: 4.0000 mg | Freq: Four times a day (QID) | INTRAMUSCULAR | Status: DC | PRN

## 2016-11-07 MED ORDER — FLEET ENEMA 7-19 GM/118ML RE ENEM: 1.0000 | ENEMA | Freq: Every day | RECTAL | Status: DC | PRN

## 2016-11-07 MED ORDER — BUPIVACAINE HCL (PF) 0.5 % IJ SOLN
INTRAMUSCULAR | Status: AC
  Administered 2016-11-07: 12:00:00
  Filled 2016-11-07: qty 30

## 2016-11-07 MED ORDER — ONDANSETRON HCL 4 MG/2ML IJ SOLN: 4.0000 mg | Freq: Three times a day (TID) | INTRAMUSCULAR | Status: DC | PRN

## 2016-11-07 MED ORDER — BISACODYL 10 MG RE SUPP: 10.0000 mg | Freq: Every day | RECTAL | Status: DC | PRN

## 2016-11-07 MED ORDER — NALBUPHINE HCL 10 MG/ML IJ SOLN: 5.0000 mg | INTRAMUSCULAR | Status: DC | PRN

## 2016-11-07 MED ORDER — FENTANYL CITRATE (PF) 100 MCG/2ML IJ SOLN: 25.0000 ug | INTRAMUSCULAR | Status: DC | PRN

## 2016-11-07 MED ORDER — TETANUS-DIPHTH-ACELL PERTUSSIS 5-2.5-18.5 LF-MCG/0.5 IM SUSP: 0.5000 mL | Freq: Once | INTRAMUSCULAR | Status: DC

## 2016-11-07 MED ORDER — LACTATED RINGERS IV SOLN: 500.0000 mL | INTRAVENOUS | Status: DC | PRN

## 2016-11-07 MED ORDER — MORPHINE SULFATE (PF) 0.5 MG/ML IJ SOLN
INTRAMUSCULAR | Status: DC | PRN
  Administered 2016-11-07: .2 mg via EPIDURAL

## 2016-11-07 MED ORDER — COCONUT OIL OIL: 1.0000 "application " | TOPICAL_OIL | Status: DC | PRN

## 2016-11-07 MED ORDER — LIDOCAINE HCL (PF) 1 % IJ SOLN: 30.0000 mL | INTRAMUSCULAR | Status: DC | PRN

## 2016-11-07 MED ORDER — OXYCODONE HCL 5 MG PO TABS
10.0000 mg | ORAL_TABLET | ORAL | Status: DC | PRN
  Administered 2016-11-08 – 2016-11-10 (×11): 10 mg via ORAL
  Filled 2016-11-07 (×11): qty 2

## 2016-11-07 MED ORDER — MEPERIDINE HCL 25 MG/ML IJ SOLN: 6.2500 mg | INTRAMUSCULAR | Status: DC | PRN

## 2016-11-07 MED ORDER — OXYTOCIN 10 UNIT/ML IJ SOLN
INTRAVENOUS | Status: DC | PRN
  Administered 2016-11-07: 40 [IU] via INTRAVENOUS

## 2016-11-07 MED ORDER — METHADONE HCL 10 MG PO TABS: 10.0000 mg | ORAL_TABLET | Freq: Three times a day (TID) | ORAL | Status: DC

## 2016-11-07 MED ORDER — METHYLERGONOVINE MALEATE 0.2 MG/ML IJ SOLN
INTRAMUSCULAR | Status: AC
  Filled 2016-11-07: qty 1

## 2016-11-07 MED ORDER — GENTAMICIN SULFATE 40 MG/ML IJ SOLN: 5.0000 mg/kg | INTRAMUSCULAR | Status: DC

## 2016-11-07 MED ORDER — EPHEDRINE SULFATE 50 MG/ML IJ SOLN
INTRAMUSCULAR | Status: DC | PRN
  Administered 2016-11-07 (×2): 10 mg via INTRAVENOUS
  Administered 2016-11-07: 5 mg via INTRAVENOUS

## 2016-11-07 MED ORDER — PRENATAL MULTIVITAMIN CH
1.0000 | ORAL_TABLET | Freq: Every day | ORAL | Status: DC
  Administered 2016-11-08 – 2016-11-10 (×3): 1 via ORAL
  Filled 2016-11-07 (×3): qty 1

## 2016-11-07 MED ORDER — SIMETHICONE 80 MG PO CHEW
80.0000 mg | CHEWABLE_TABLET | Freq: Three times a day (TID) | ORAL | Status: DC
  Administered 2016-11-07 – 2016-11-10 (×8): 80 mg via ORAL
  Filled 2016-11-07 (×8): qty 1

## 2016-11-07 MED ORDER — ONDANSETRON HCL 4 MG/2ML IJ SOLN
INTRAMUSCULAR | Status: DC | PRN
  Administered 2016-11-07: 4 mg via INTRAVENOUS

## 2016-11-07 MED ORDER — SODIUM CHLORIDE 0.9 % IJ SOLN
INTRAMUSCULAR | Status: AC
  Filled 2016-11-07: qty 50

## 2016-11-07 MED ORDER — LACTATED RINGERS IV SOLN
INTRAVENOUS | Status: DC
  Administered 2016-11-07: 10:00:00 via INTRAVENOUS

## 2016-11-07 MED ORDER — GLYCOPYRROLATE 0.2 MG/ML IJ SOLN
INTRAMUSCULAR | Status: AC
  Filled 2016-11-07: qty 1

## 2016-11-07 MED ORDER — SIMETHICONE 80 MG PO CHEW: 80.0000 mg | CHEWABLE_TABLET | ORAL | Status: DC

## 2016-11-07 MED ORDER — DIPHENHYDRAMINE HCL 50 MG/ML IJ SOLN: 12.5000 mg | INTRAMUSCULAR | Status: DC | PRN

## 2016-11-07 MED ORDER — OXYTOCIN 10 UNIT/ML IJ SOLN
INTRAMUSCULAR | Status: AC
  Filled 2016-11-07: qty 4

## 2016-11-07 MED ORDER — OXYCODONE-ACETAMINOPHEN 5-325 MG PO TABS: 2.0000 | ORAL_TABLET | ORAL | Status: DC | PRN

## 2016-11-07 SURGICAL SUPPLY — 24 items
BARRIER ADHS 3X4 INTERCEED (GAUZE/BANDAGES/DRESSINGS) ×2 IMPLANT
BRR ADH 4X3 ABS CNTRL BYND (GAUZE/BANDAGES/DRESSINGS) ×1
CANISTER SUCT 3000ML PPV (MISCELLANEOUS) ×3 IMPLANT
CHLORAPREP W/TINT 26ML (MISCELLANEOUS) ×3 IMPLANT
DRSG TELFA 3X8 NADH (GAUZE/BANDAGES/DRESSINGS) ×3 IMPLANT
ELECT REM PT RETURN 9FT ADLT (ELECTROSURGICAL) ×3
ELECTRODE REM PT RTRN 9FT ADLT (ELECTROSURGICAL) ×1 IMPLANT
GAUZE SPONGE 4X4 12PLY STRL (GAUZE/BANDAGES/DRESSINGS) ×3 IMPLANT
GOWN STRL REUS W/ TWL LRG LVL3 (GOWN DISPOSABLE) ×3 IMPLANT
GOWN STRL REUS W/TWL LRG LVL3 (GOWN DISPOSABLE) ×9
NS IRRIG 1000ML POUR BTL (IV SOLUTION) ×3 IMPLANT
PAD DRESSING TELFA 3X8 NADH (GAUZE/BANDAGES/DRESSINGS) ×1 IMPLANT
PAD OB MATERNITY 4.3X12.25 (PERSONAL CARE ITEMS) ×3 IMPLANT
PAD PREP 24X41 OB/GYN DISP (PERSONAL CARE ITEMS) ×3 IMPLANT
SUT MNCRL 4-0 (SUTURE)
SUT MNCRL 4-0 27XMFL (SUTURE)
SUT PDS AB 1 TP1 96 (SUTURE) ×3 IMPLANT
SUT PLAIN 2 0 XLH (SUTURE) ×3 IMPLANT
SUT PLAIN GUT 2-0 30 C14 SG823 (SUTURE) ×3
SUT VIC AB 0 CT1 36 (SUTURE) ×9 IMPLANT
SUT VIC AB 3-0 SH 27 (SUTURE) ×3
SUT VIC AB 3-0 SH 27X BRD (SUTURE) ×1 IMPLANT
SUTURE MNCRL 4-0 27XMF (SUTURE) IMPLANT
SUTURE PLN GUT2-0 30 C14 SG823 (SUTURE) ×1 IMPLANT

## 2016-11-07 NOTE — Discharge Summary (Signed)
Obstetrical Discharge Summary  Patient Name: Stacey Hoffman DOB: May 02, 1975 MRN: [id number]  Date of Admission: 11/07/2016 Date of Discharge: 11/10/16 Primary OB: None- she did see [facility] x  Gestational Age at Delivery: [redacted]w[redacted]d by 15wks u/s. NICU states the baby is more like 34wks  Antepartum complications:  No prenatal care Multiple preterm deliveries Prior cesarean section x3 Maintained on methadone during this pregnancy Substance Abuse Cocaine hx  Admitting Diagnosis:  Secondary Diagnosis: Patient Active Problem List   Diagnosis Date Noted  . Indication for care in labor or delivery 11/07/2016  . Premature labor   . Short cervical length during pregnancy   . Preterm labor 02/17/2014  . Previous cesarean delivery x 3, antepartum 02/17/2014  . Opioid dependence (HCC) 02/17/2014  . History of herpes genitalis 02/17/2014  . Smoker 02/17/2014  . Short cervix affecting pregnancy 02/17/2014  . Rubella non-immune status, antepartum 02/17/2014  . AMA (advanced maternal age) multigravida 35+ 02/17/2014  . H/O LEEP 02/17/2014  . [redacted] weeks gestation of pregnancy     Complications: None Intrapartum complications/course:   Pt presented actively laboring with no prenatal care and prior c/s x3 per her report. Cervix at 7cm, and pt taken for stat C/S with BTL per her request  Date of Delivery: 11/07/16 Delivered By: Christeen Douglas, MD Delivery Type: repeat cesarean section, low transverse incision Anesthesia: spinal Placenta: extracted Laceration: none Episiotomy: none Newborn Data: Live born female  Birth Weight:  2080g APGAR: 9, 9    Discharge Physical Exam: 11/10/16 BP 123/68 (BP Location: Left Arm)   Pulse 82   Temp 98.6 F (37 C) (Oral)   Resp 18   Ht  (1.499 m)   Wt 68 kg (150 lb)   SpO2 99%   Breastfeeding? Unknown   BMI 30.30 kg/m   General: NAD CV: RRR Pulm: CTABL, nl effort ABD: s/nd/nt, fundus firm and below the umbilicus Lochia:  moderate Incision: c/d/i  DVT Evaluation: LE non-ttp, no evidence of DVT on exam.  Hemoglobin  Date Value Ref Range Status  11/09/2016 10.3 (L) 12.0 - 16.0 g/dL Final   HCT  Date Value Ref Range Status  11/09/2016 29.7 (L) 35.0 - 47.0 % Final    Post partum course: pt eval by psychiatry and felt see is not a risk to herself or other . All meds she was on were written for . She has a f/up appt with her psychiatrist Dr Stacey Hoffman Postpartum Procedures: none Disposition: stable, discharge to home. Baby Feeding: formula  Baby Disposition: in nursery   Rh Immune globulin given: Rubella vaccine given:  Tdap vaccine given in AP or PP setting:  Flu vaccine given in AP or PP setting:   Contraception: BTL  Prenatal Labs:   O+ Rubella IMM , varicellla Imm RPR NR HIV neg Unknown GBS  Plan:  Stacey Hoffman was discharged to home in good condition. Follow-up appointment at [facility] in 2 weeks   Discharge Medications: Allergies as of 11/10/2016      Reactions   Penicillins Other (See Comments)   Childhood reaction unknown   Shellfish Allergy Hives   Sulfa Antibiotics Swelling      Medication List    STOP taking these medications   CITRACAL PLUS Tabs   methadone 10 MG tablet Commonly known as:  DOLOPHINE   metroNIDAZOLE 500 MG tablet Commonly known as:  FLAGYL     TAKE these medications   ferrous sulfate 325 (65 FE) MG tablet Take 325 mg  by mouth daily with breakfast.   ibuprofen 800 MG tablet Commonly known as:  ADVIL,MOTRIN Take 1 tablet (800 mg total) by mouth every 8 (eight) hours as needed.   ondansetron 4 MG/2ML Soln injection Commonly known as:  ZOFRAN Inject 2 mLs (4 mg total) into the vein once as needed for nausea.   oxyCODONE 5 MG immediate release tablet Commonly known as:  Oxy IR/ROXICODONE Take 1 tablet (5 mg total) by mouth every 4 (four) hours as needed (pain scale 4-7). What changed:  medication strength  how much to take  when  to take this  reasons to take this  additional instructions   prenatal multivitamin Tabs tablet Take 1 tablet by mouth daily at 12 noon.            Discharge Care Instructions        Start     Ordered   11/10/16 0000  ondansetron (ZOFRAN) 4 MG/2ML SOLN injection  Once PRN     11/10/16 1143   11/10/16 0000  oxyCODONE (OXY IR/ROXICODONE) 5 MG immediate release tablet  Every 4 hours PRN     11/10/16 1143   11/10/16 0000  ibuprofen (ADVIL,MOTRIN) 800 MG tablet  Every 8 hours PRN     11/10/16 1143   11/10/16 0000  Diet - low sodium heart healthy     11/10/16 1143   11/10/16 0000  Call MD for:  extreme fatigue     11/10/16 1143   11/10/16 0000  Call MD for:  persistant dizziness or light-headedness     11/10/16 1143   11/10/16 0000  Call MD for:  hives     11/10/16 1143   11/10/16 0000  Call MD for:  difficulty breathing, headache or visual disturbances     11/10/16 1143   11/10/16 0000  Call MD for:  redness, tenderness, or signs of infection (pain, swelling, redness, odor or green/yellow discharge around incision site)     11/10/16 1143   11/10/16 0000  Call MD for:  severe uncontrolled pain     11/10/16 1143   11/10/16 0000  Call MD for:  persistant nausea and vomiting     11/10/16 1143   11/10/16 0000  Call MD for:  temperature >100.4     11/10/16 1143      Follow-up Information    Christeen Douglas, MD Follow up in 2 week(s).   Specialty:  Obstetrics and Gynecology Why:  post op Contact information: [address] [place] [address] [phone]        Christeen Douglas, MD .   Specialty:  Obstetrics and Gynecology Contact information: [phone] [place] [address] [phone]           Signed: Ihor Austin Schermerhorn MD  11/10/16

## 2016-11-07 NOTE — Progress Notes (Signed)
41yo [id number] approximately at about 34+6wks EGD by u/s in the ER at 15wks and a hx of no prenatal care this pregnancy, who presents PPROM last night, now in active labor. On my exam she is 7cm, +1 station, and difficult to consent due to pain in labor. However, she does ask for a repeat cesarean section. She endorses BZD last night but no other drugs. She does have cocaine use in her problem list, but denies it today. She was seen on 07/16/[facility] at [facility] for genetic counseling and   She also has a hx of genital HSV for which her first section was done, but doesn't remember any actual outbreaks. Speculum exam is insufficient to rule this in or out at 7cm, as fetal head obscuring cervix.  Her FHT is Cat II with +accels, +variable decels.   While she is actively laboring and approx 36 weeks by fundal height and Leopold's, in the setting of HSV uncertainty, prior 3 cesarean sections without operative reports and unknown dating of those surgeries and this pregnancy, we will proceed with repeat cesarean section. She has expressed the desire for BTL, which I will offer when she is comfortable with regional anesthesia.   The risks of cesarean section discussed with the patient included but were not limited to: bleeding which may require transfusion or reoperation; infection which may require antibiotics; injury to bowel, bladder, ureters or other surrounding organs; injury to the fetus; need for additional procedures including hysterectomy in the event of a life-threatening hemorrhage; placental abnormalities wth subsequent pregnancies, incisional problems, thromboembolic phenomenon and other postoperative/anesthesia complications. The patient concurred with the proposed plan, giving informed written consent for the procedure.   Patient has been NPO since yesterday she will remain NPO for procedure. Anesthesia and OR aware. Preoperative prophylactic antibiotics and SCDs ordered on call to the OR.  To OR when ready.

## 2016-11-07 NOTE — Progress Notes (Signed)
Discussion with patient regarding her medications at home:  Ativan  daily Hx of suboxone use recently, though she says not in the last "little while" and endorses maybe over a week. She requests that we don't restart it. She says her psychiatrist gives her the BZD, and the pain medicine doctor occasionally oxycodone.   For now, I did use Exparel along the fascial and skin incisions. She received Duramorph with her spinal. I will order 5 and  oxycodone for as needed, and will give her 3 days of gabapentin, and scheduled tylenol and motrin. We will reassess as needed.

## 2016-11-07 NOTE — Anesthesia Procedure Notes (Signed)
Date/Time: 11/07/2016 11:05 AM Performed by: Marlana Salvage Pre-anesthesia Checklist: Patient identified, Emergency Drugs available, Suction available, Patient being monitored and Timeout performed Oxygen Delivery Method: Nasal cannula Placement Confirmation: positive ETCO2

## 2016-11-07 NOTE — Op Note (Addendum)
Cesarean Section Procedure Note  Indications: herpes virus infection and previous uterine incision unknown  Pre-operative Diagnosis:  1. Intrauterine pregnancy at [redacted]w[redacted]d;  2. Desires permanent sterilization; she stated that she desired sterilization on arrival, with me in the labor room, and after receiving pain control with spinal. She states that every time she gets pregnant she is so upset she tries to divorce her husband. In her words, "I have too many kids already. Praise the Lord, I can get my tubes tied." She expressed gratitude during and following the surgery for the BTL.  Post-operative Diagnosis: same, delivered.  Procedure: 1. Low Transverse Cesarean Section through Pfannenstiel incision  2. Bilateral tubal sterilization using modified Parkland method  Surgeon: Christeen Douglas, MD  Assistant(s):  Milon Score, CNM  Anesthesia: Spinal anesthesia  Estimated Blood Loss:  200 mL         Drains: none         Total IV Fluids: see anesthesia   Urine Output: see anesthesia notes          Specimens: placenta, right and left tube         Complications:  None; patient tolerated the procedure well.         Disposition: PACU - hemodynamically stable.         Condition: stable  Findings:  A female infant in cephalic presentation. Amniotic fluid - Clear  Birth weight 2080 g.  Apgars of 9 and 9.   Intact placenta with a three-vessel cord.  Grossly normal uterus, tubes and ovaries bilaterally. Filmy intraabdominal adhesions were noted.  Procedure Details  The patient was taken to Operating Room, identified as the correct patient and the procedure verified as C-Section Delivery. A Time Out was held and the above information confirmed.  After induction of anesthesia, the patient was draped and prepped in the usual sterile manner. A Pfannenstiel incision was made and carried down through the subcutaneous tissue to the fascia. Fascial incision was made and extended transversely with  the Mayo scissors. The fascia was separated from the underlying rectus tissue superiorly and inferiorly. The peritoneum was identified and entered bluntly. Peritoneal incision was extended longitudinally. The utero-vesical peritoneal reflection was incised transversely and a bladder flap was created digitally.   A low transverse hysterotomy was made. The fetus was delivered atraumatically. The umbilical cord was clamped x2 and cut and the infant was handed to the awaiting pediatricians. The placenta was removed intact and appeared normal with a 3-vessel cord.   The uterus was exteriorized and cleared of all clot and debris. The hysterotomy was closed with running sutures of  0-Vicryl. A second imbricating layer was placed with the same suture. Excellent hemostasis was observed.   Attention was then turned to the tubal ligation. The left fallopian tube distinguished from the round ligament by identifying the fimbria and was grasped with a Babcock clamp in the midisthmic portion approximately 3 cm from the cornual region. It was then doubly ligated with 0-plain gut suture in a Parkland fashion, taking the fimbrea. The tubal segment was excised with Metzenbaum scissors. Tubal ostea noted. The procedure was repeated on the right side. *Care was noted to examine both tubal sites in situ to ensure the sutures were intact and no bleeding was noted. The uterus was returned to the abdomen.  The pelvis was irrigated and again, excellent hemostasis was noted. The fascia was then reapproximated with running sutures of 0 Vicryl.  The subcutaneous tissue was reapproximated with interrupted sutures of  plain guy. The skin was reapproximated with a 4-0 Monocryl subcuticular stitch. Liposomal bupivacaine used at fascia and skin edges.   Instrument, sponge, and needle counts were correct prior to the abdominal closure and at the conclusion of the case.   The patient tolerated the procedure well and was transferred to the  recovery room in stable condition.   Christeen Douglas, MD9/23/2018

## 2016-11-07 NOTE — Clinical Social Work Note (Signed)
RNCM received a consult for social needs. This consult is the purview of CSW department. CSW will assess when able. CSW is following.  Argentina Ponder, MSW, Theresia Majors [phone]

## 2016-11-07 NOTE — Progress Notes (Addendum)
Patient ID: Stacey Hoffman, female   DOB: [birth year], 41 y.o.   MRN: [id number] Late Entry:A full speculum exam was done prior to calling the Stat C-section. No lesions of the ext genitalia, no vaginal or cervical lesion. No rectal lesions. Nsy aware of no herpetic lesion seen.

## 2016-11-07 NOTE — H&P (Signed)
HPI  Stacey Hoffman is a 41 y.o. female with PMHx HLD, multiple pregnancies complicated by pre-term labor (G9 (?)P4 A5(?)) who presents with lower abdominal cramping and poss ROM last night at 1930.  Pt unsure date of LMP, states sometime in mid-Feb. Reports that husband is long distance truck driver and has been away since early Feb and just returned mid Feb and they resumed sex. 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 No Hx of known HSV lesions, Pt has had 1st SVD of Twins, 2nd Baby:LTCS for HSV hx and no lesions, 3rd:LTCS, 4th:LTCS, pt had 4 or 5 AB's but, is not clear due to sedation of pt by ?Marland Kitchen Last preg pt had VBAC with 1 pound delivery. Pt is crying out for a lTCS.  Past Medical History:  Diagnosis Date  . Hyperlipemia  Substance Abuse Cocaine hx There is no problem list on file for this patient.  Past Surgical History:  Procedure Laterality Date  . CESAREAN SECTION   No current facility-administered medications for this encounter.   MEDS:Suboxone, Clanazempam or Ativan, Adderall, Avance per pt not records   Allergies Egg; Venom-honey bee; Sulfa (sulfonamide antibiotics); and Penicillins  No family history on file.  Social History  Substance Use Topics  . Smoking status: Current Every Day Smoker  Packs/day: 1.00  Types: Cigarettes  . Smokeless tobacco: Not on file  . Alcohol use No   Review of Systems Constitutional: Negative for fever. Eyes: Negative for visual changes. ENT: Negative for sore throat. Cardiovascular: Negative for chest pain. Respiratory: Negative for shortness of breath. Gastrointestinal: Negative for vomiting or diarrhea. Positive for abdominal cramping.  Genitourinary: Negative for dysuria.  Musculoskeletal: Negative for back pain. Skin: Negative for rash. Neurological: Negative for headaches, focal weakness or numbness.      OB History    Gravida Para Term Preterm AB Living   SAB TAB Ectopic Multiple Live Births    0 3 0 1 6     Past Medical History:  Diagnosis Date  . Anxiety   . Back pain   . Gastritis   Hyperlipidemia Substance Abuse  Past Surgical History:  Procedure Laterality Date  . CESAREAN SECTION    . IUD REMOVAL N/A    IUD imbeded into uterus   Family History: family history is not on file. Social History:  reports that she has been smoking Cigarettes.  She has a 22.50 pack-year smoking history. She does not have any smokeless tobacco history on file. She reports that she uses drugs, including Oxycodone and Other-see comments. She reports that she does not drink alcohol.     Maternal Diabetes: not done  Genetic Screening: not done Maternal Ultrasounds/Referrals:ER scan done on 06/28/16 indicating she was due OCT 29th Fetal Ultrasounds or other Referrals:  None Maternal Substance Abuse:  +on Suboxone, Ativan or Clonazepam, Adderall, Avance Significant Maternal Medications:  ? Significant Maternal Lab Results: GBS unknown, no other labs done Other Comments:    ROS per above  History Exam  Cx:5/100/vtx-1 Physical Exam  Prenatal labs: ABO, Rh:  unknown Antibody:  unknown Rubella:  unknown RPR:   unknown HBsAg:   unknown HIV:   unknown  GBS:   unknown  Assessment/Plan: A:1. IUP at 35 weeks approximately 2. No PNC 3. Suboxone hx 4. HSV+ P:1. STAT EKG 2. BMZ 3. Clindamycin for GBS coverage 4. Drug screen 5. Dr Bernestine Amass was called for VBAC vs LTCS at 1015.  Anesthesia was called at 1020am to consult. Labs and EKG being done.    Sharee Pimple 11/07/2016, 10:05 AM

## 2016-11-07 NOTE — Transfer of Care (Signed)
Immediate Anesthesia Transfer of Care Note  Patient: Stacey Hoffman  Procedure(s) Performed: Procedure(s): CESAREAN SECTION  with bilateral tubal ligation  Patient Location: PACU  Anesthesia Type:Spinal  Level of Consciousness: drowsy and patient cooperative  Airway & Oxygen Therapy: Patient Spontanous Breathing  Post-op Assessment: Report given to RN and Post -op Vital signs reviewed and stable  Post vital signs: Reviewed and stable  Last Vitals:  Vitals:   11/07/16 0939 11/07/16 1217  BP: (!) 85/71 (!) 82/59  Pulse: 71 81  Resp:  12  Temp: 37.2 C   SpO2:  100%    Last Pain:  Vitals:   11/07/16 1217  TempSrc: Oral         Complications: No apparent anesthesia complications

## 2016-11-07 NOTE — Anesthesia Post-op Follow-up Note (Signed)
Anesthesia QCDR form completed.        

## 2016-11-07 NOTE — Care Management Note (Signed)
Case Management Note  Patient Details  Name: Stacey Hoffman MRN: [id number] Date of Birth: May 16, 1975  Subjective/Objective:     CSW is aware of Consult for social issues sent to CM by error.                Action/Plan:   Expected Discharge Date:                  Expected Discharge Plan:     In-House Referral:     Discharge planning Services     Post Acute Care Choice:    Choice offered to:     DME Arranged:    DME Agency:     HH Arranged:    HH Agency:     Status of Service:     If discussed at [employer] of Stay Meetings, dates discussed:    Additional Comments:  Veronia Laprise A, RN 11/07/2016, 4:34 PM

## 2016-11-07 NOTE — Consult Note (Signed)
Neonatology Note:   Attendance at C-section:    I was asked by Dr. Dalbert Garnet to attend this repeat C/S at approximately 34-[redacted] weeks GA (unknown due to no [facility]). The mother is a G9P6A3L4 O pos, GBS unknown with SROM, labor, and no PNC. The mother is on Suboxone, Adderall, and Clonazepam, and has a history of substance abuse, including opiates and cocaine. She is a cigarette smoker 1 pack/day. She has a history of HSV, although she has never had any lesions (tested positive in the past), and does not have any active lesions currently on exam. ROM 15 hours prior to delivery, fluid clear. Got 1 dose of Clindamycin 1 hour before delivery. Afebrile.  Infant vigorous with good spontaneous cry and tone. Delayed cord clamping was done. Needed no suctioning. Ap 9/9. Lungs clear to ausc in DR, no distress, O2 sats normal in room air. GA estimated to be 34 weeks by exam, weight is 4 pounds, 9 oz. Shown to mother, then transported to [facility] for further care.   Doretha Sou, MD

## 2016-11-07 NOTE — Anesthesia Preprocedure Evaluation (Addendum)
Anesthesia Evaluation  Patient identified by MRN, date of birth, ID band Patient awake    Reviewed: Allergy & Precautions, NPO status , Patient's Chart, lab work & pertinent test results, reviewed documented beta blocker date and time   Airway Mallampati: II  TM Distance: >3 FB     Dental  (+) Chipped   Pulmonary Current Smoker,           Cardiovascular      Neuro/Psych    GI/Hepatic   Endo/Other    Renal/GU      Musculoskeletal   Abdominal   Peds  Hematology   Anesthesia Other Findings Drug dependence. On subaxone. Multiple C-sections. Will avoid betadine, use chlor.  Reproductive/Obstetrics                            Anesthesia Physical Anesthesia Plan  ASA: III  Anesthesia Plan: Spinal   Post-op Pain Management:    Induction:   PONV Risk Score and Plan:   Airway Management Planned:   Additional Equipment:   Intra-op Plan:   Post-operative Plan:   Informed Consent: I have reviewed the patients History and Physical, chart, labs and discussed the procedure including the risks, benefits and alternatives for the proposed anesthesia with the patient or authorized representative who has indicated his/her understanding and acceptance.     Plan Discussed with: CRNA  Anesthesia Plan Comments:         Anesthesia Quick Evaluation

## 2016-11-07 NOTE — Progress Notes (Signed)
Disc with Dr Dalbert Garnet the need for stopping the Methadone and the fact that we do not know the clinic or dose, Will keep her on Narcs and add Atitvan and pt is also getting Gabapentin so should hold pt. Will have nursing call in the am her clinic for regimen.

## 2016-11-07 NOTE — Anesthesia Procedure Notes (Signed)
Spinal  Patient location during procedure: OR Staffing Anesthesiologist: Kreig Parson Performed: anesthesiologist  Preanesthetic Checklist Completed: patient identified, site marked, surgical consent, pre-op evaluation, timeout performed, IV checked and risks and benefits discussed Spinal Block Patient position: sitting Prep: Betadine Patient monitoring: heart rate, cardiac monitor, continuous pulse ox and blood pressure Approach: midline Location: L3-4 Injection technique: single-shot Needle Needle type: Pencil-Tip  Needle gauge: 25 G Needle length: 9 cm Assessment Sensory level: T10     

## 2016-11-08 ENCOUNTER — Encounter: Payer: Self-pay | Admitting: Obstetrics and Gynecology

## 2016-11-08 LAB — CBC
HEMATOCRIT: 27.2 % — AB (ref 35.0–47.0)
HEMOGLOBIN: 9.5 g/dL — AB (ref 12.0–16.0)
MCH: 31 pg (ref 26.0–34.0)
MCHC: 34.9 g/dL (ref 32.0–36.0)
MCV: 88.7 fL (ref 80.0–100.0)
Platelets: 285 10*3/uL (ref 150–440)
RBC: 3.07 MIL/uL — ABNORMAL LOW (ref 3.80–5.20)
RDW: 13.1 % (ref 11.5–14.5)
WBC: 21.9 10*3/uL — ABNORMAL HIGH (ref 3.6–11.0)

## 2016-11-08 MED ORDER — BUPIVACAINE IN DEXTROSE 0.75-8.25 % IT SOLN
INTRATHECAL | Status: AC
  Filled 2016-11-08: qty 2

## 2016-11-08 MED ORDER — INFLUENZA VAC SPLIT QUAD 0.5 ML IM SUSY: 0.5000 mL | PREFILLED_SYRINGE | INTRAMUSCULAR | Status: DC

## 2016-11-08 MED ORDER — KETOROLAC TROMETHAMINE 60 MG/2ML IM SOLN
60.0000 mg | Freq: Three times a day (TID) | INTRAMUSCULAR | Status: DC | PRN
  Administered 2016-11-08 – 2016-11-10 (×2): 60 mg via INTRAMUSCULAR
  Filled 2016-11-08 (×4): qty 2

## 2016-11-08 NOTE — Anesthesia Postprocedure Evaluation (Signed)
Anesthesia Post Note  Patient: Stacey Hoffman  Procedure(s) Performed: Procedure(s): CESAREAN SECTION  with bilateral tubal ligation  Patient location during evaluation: Mother Baby Anesthesia Type: Spinal Level of consciousness: awake and alert and oriented Pain management: satisfactory to patient Vital Signs Assessment: post-procedure vital signs reviewed and stable Respiratory status: respiratory function stable Cardiovascular status: stable Postop Assessment: no headache, no backache, patient able to bend at knees, spinal receding, no apparent nausea or vomiting and adequate PO intake Anesthetic complications: no     Last Vitals:  Vitals:   11/07/16 2347 11/08/16 0422  BP: (!) 124/58 (!) 93/58  Pulse: 80 73  Resp: 18 18  Temp: 36.6 C 36.8 C  SpO2:      Last Pain:  Vitals:   11/08/16 0600  TempSrc:   PainSc: 7                  Clydene Pugh

## 2016-11-08 NOTE — Anesthesia Post-op Follow-up Note (Signed)
  Anesthesia Pain Follow-up Note  Patient: Stacey Hoffman  Day #: 1  Date of Follow-up: 11/08/2016 Time: 7:10 AM  Last Vitals:  Vitals:   11/07/16 2347 11/08/16 0422  BP: (!) 124/58 (!) 93/58  Pulse: 80 73  Resp: 18 18  Temp: 36.6 C 36.8 C  SpO2:      Level of Consciousness: alert  Pain: mild   Side Effects:None  Catheter Site Exam:clean, dry     Plan: D/C from anesthesia care at surgeon's request  Clydene Pugh

## 2016-11-08 NOTE — Clinical Social Work Note (Signed)
CSW consulted to see patient due to a mental health history. CSW informed by patient's nurse that patient has stated she wants none of her medical information to be provided to her family, including her husband. Unfortunately at this time, patient's husband and younger child are in patient's room. CSW has asked nursing to call CSW when/if patient's husband leaves. York Spaniel MSW,LCSW [phone]

## 2016-11-08 NOTE — Progress Notes (Signed)
Subjective: Postpartum Day 1: Cesarean Delivery + BTL  Suboxone use , but has not taken it for several days  . On  in am and 10 at night .  incisional pain on tylenol , motrin and roxicodone  BP low this AM . Good UO and am HCT 27.2  Patient reports incisional pain.    Objective: Vital signs in last 24 hours: Temp:  [97.4 F (36.3 C)-98.6 F (37 C)] 98.2 F (36.8 C) (09/24 0759) Pulse Rate:  [57-91] 64 (09/24 0835) Resp:  [7-24] 18 (09/24 0835) BP: (73-124)/(40-87) 90/49 (09/24 0835) SpO2:  [97 %-100 %] 98 % (09/24 0835) Weight:  [57.6 kg (127 lb)-68 kg (150 lb)] 68 kg (150 lb) (09/23 1110)  Physical Exam:  General: alert and cooperative Lochia: appropriate  abd : non distended  Soft  Uterine Fundus: firm Incision: covered  And dry  DVT Evaluation: No evidence of DVT seen on physical exam.   Recent Labs  11/07/16 1019 11/08/16 0418  HGB 10.6* 9.5*  HCT 29.8* 27.2*    Assessment/Plan: Status post Cesarean section. Doing well postoperatively. low BP , hemodynamically stable  with nl mentation good urine out and nl post HCT   Narcotic abuse 1 yr ago and on Suboxone  Psych consult and social services .  Stacey Hoffman 11/08/2016, 10:11 AM

## 2016-11-09 DIAGNOSIS — F1120 Opioid dependence, uncomplicated: Secondary | ICD-10-CM

## 2016-11-09 LAB — CBC
HCT: 29.7 % — ABNORMAL LOW (ref 35.0–47.0)
HEMOGLOBIN: 10.3 g/dL — AB (ref 12.0–16.0)
MCH: 31.7 pg (ref 26.0–34.0)
MCHC: 34.6 g/dL (ref 32.0–36.0)
MCV: 91.5 fL (ref 80.0–100.0)
PLATELETS: 316 10*3/uL (ref 150–440)
RBC: 3.25 MIL/uL — AB (ref 3.80–5.20)
RDW: 13.2 % (ref 11.5–14.5)
WBC: 15.6 10*3/uL — ABNORMAL HIGH (ref 3.6–11.0)

## 2016-11-09 LAB — HIV ANTIBODY (ROUTINE TESTING W REFLEX): HIV Screen 4th Generation wRfx: NONREACTIVE

## 2016-11-09 LAB — RUBELLA SCREEN: RUBELLA: 2.27 {index} (ref >0.99)

## 2016-11-09 LAB — SURGICAL PATHOLOGY

## 2016-11-09 LAB — VARICELLA ZOSTER ANTIBODY, IGG: VARICELLA IGG: 1263 {index} (ref >165)

## 2016-11-09 LAB — RPR: RPR: NONREACTIVE

## 2016-11-09 MED ORDER — FERROUS SULFATE 325 (65 FE) MG PO TABS
325.0000 mg | ORAL_TABLET | Freq: Every day | ORAL | Status: DC
  Administered 2016-11-10: 325 mg via ORAL
  Filled 2016-11-09: qty 1

## 2016-11-09 NOTE — Progress Notes (Signed)
  Subjective:   Doing well.  No complaints.  Voiding, ambulating, tolerating regular PO diet, tolerating pain with PO meds. Denies: CP SOB F/C, N/V, calf pain   Complains of some tugging pain on the left side.   Objective:  Blood pressure 96/61, pulse 65, temperature 98.4 F (36.9 C), temperature source Oral, resp. rate 18, height  (1.499 m), weight 68 kg (150 lb), SpO2 99 %, unknown if currently breastfeeding.  General: NAD Pulmonary: no increased work of breathing Abdomen: non-distended, non-tender, fundus firm at level of umbilicus Incision: bandaged, c/d/i Extremities: no edema, no erythema, no tenderness     Assessment:   41 y.o. [id number] postoperativeday # 2   Plan:  1) Acute blood loss anemia - hemodynamically stable and asymptomatic  2. Baby  In NICU, she is going to visit today for the first time.  3. Pain - continue PO meds, encourage OOB and movement.  Remove bandage after shower.  4. Methadone - continue routine dose  5. Repeat CBC for leukocytosis  6. Continue inpatient care.  ----- Ranae Plumber, MD Attending Obstetrician and Gynecologist [facility], Department of OB/GYN [facility]

## 2016-11-09 NOTE — Consult Note (Signed)
[facility] Consult   Reason for Consult:  Consult for 41 year old woman who just delivered a baby by cesarean section. Concern about her chronic use of controlled substances Referring Physician:  Leafy Ro Patient Identification: Stacey Hoffman MRN:  [id number] Principal Diagnosis: Opioid dependence [facility]) Diagnosis:   Patient Active Problem List   Diagnosis Date Noted  . Indication for care in labor or delivery [O75.9] 11/07/2016  . Premature labor [O60.00]   . Short cervical length during pregnancy [O26.879]   . Preterm labor [O60.00] 02/17/2014  . Previous cesarean delivery x 3, antepartum [O34.219] 02/17/2014  . Opioid dependence ([place]) [F11.20] 02/17/2014  . History of herpes genitalis [Z86.19] 02/17/2014  . Smoker [F17.200] 02/17/2014  . Short cervix affecting pregnancy [O26.879] 02/17/2014  . Rubella non-immune status, antepartum [O99.89, Z28.3] 02/17/2014  . AMA (advanced maternal age) multigravida 52+ [O09.529] 02/17/2014  . H/O LEEP [Z98.890] 02/17/2014  . [redacted] weeks gestation of pregnancy [Z3A.24]     Total Time spent with patient: 45 minutes  Subjective:   Stacey Hoffman is a 41 y.o. female patient admitted with "I'm having pain".  HPI:  Patient seen chart reviewed. 41 year old woman who came in to the hospital and had a cesarean section baby. Apparently concern was raised about her positive drug screen. Patient is not reporting any acute psychiatric symptoms. Denies depression. Denies suicidal or homicidal ideation. Denies any psychotic symptoms. Reports that her mood is feeling somewhat irritated and she is having pain issues but otherwise does not feel she has acute concerns. Patient is able to report to me correctly her chronic doses of Xanax Adderall Vyvanse and Suboxone. I checked the controlled substance database and confirm that she is still prescribed all of those medicines by her outpatient psychiatrist and was taking them throughout her pregnancy  apparently. Patient denies that she's been using any other drugs. Denies alcohol abuse or any other recent substance abuse.  Social history: Husband is a Programmer, systems. Several other children at home. Patient says she feels confident about going home with the baby.  Medical history: Patient apparently has history of chronic pain which by her report is how she started abusing opiates.  Substance abuse history: Patient very much does not want to discuss this. Gets irritated when we got into that part of the conversation. Tells me that she was taking pain medicine and then switched to methadone and then just switched to Suboxone. Denies really having had any substance abuse issues.  Past Psychiatric History: Does not want to discuss it in any detail. Has had some anxiety symptoms before and apparently is treated for ADHD. Denies any psychiatric hospitalizations suicide attempts and psychotic symptoms or violence.  Risk to Self: Is patient at risk for suicide?: No Risk to Others:   Prior Inpatient Therapy:   Prior Outpatient Therapy:    Past Medical History:  Past Medical History:  Diagnosis Date  . Anxiety   . Asthma   . Back pain   . Coronary artery disease   . Gastritis   . Preterm labor     Past Surgical History:  Procedure Laterality Date  . CESAREAN SECTION    . CESAREAN SECTION  11/07/2016   Procedure: CESAREAN SECTION  with bilateral tubal ligation;  Surgeon: Benjaman Kindler, MD;  Location: [facility] ORS;  Service: Obstetrics;;  . IUD REMOVAL N/A    IUD imbeded into uterus   Family History: History reviewed. No pertinent family history. Family Psychiatric  History: Denies any Social History:  History  Alcohol Use No     History  Drug Use  . Types: Oxycodone, Other-see comments    Social History   Social History  . Marital status: Married    Spouse name: N/A  . Number of children: N/A  . Years of education: N/A   Social History Main Topics  . Smoking  status: Current Every Day Smoker    Packs/day: 1.50    Years: 15.00    Types: Cigarettes    Last attempt to quit: 11/07/2016  . Smokeless tobacco: Never Used  . Alcohol use No  . Drug use: Yes    Types: Oxycodone, Other-see comments  . Sexual activity: No   Other Topics Concern  . None   Social History Narrative  . None   Additional Social History:    Allergies:   Allergies  Allergen Reactions  . Penicillins Other (See Comments)    Childhood reaction unknown  . Shellfish Allergy Hives  . Sulfa Antibiotics Swelling    Labs:  Results for orders placed or performed during the hospital encounter of 11/07/16 (from the past 48 hour(s))  CBC     Status: Abnormal   Collection Time: 11/08/16  4:18 AM  Result Value Ref Range   WBC 21.9 (H) 3.6 - 11.0 K/uL   RBC 3.07 (L) 3.80 - 5.20 MIL/uL   Hemoglobin 9.5 (L) 12.0 - 16.0 g/dL   HCT 27.2 (L) 35.0 - 47.0 %   MCV 88.7 80.0 - 100.0 fL   MCH 31.0 26.0 - 34.0 pg   MCHC 34.9 32.0 - 36.0 g/dL   RDW 13.1 11.5 - 14.5 %   Platelets 285 150 - 440 K/uL  CBC     Status: Abnormal   Collection Time: 11/09/16  6:25 PM  Result Value Ref Range   WBC 15.6 (H) 3.6 - 11.0 K/uL   RBC 3.25 (L) 3.80 - 5.20 MIL/uL   Hemoglobin 10.3 (L) 12.0 - 16.0 g/dL   HCT 29.7 (L) 35.0 - 47.0 %   MCV 91.5 80.0 - 100.0 fL   MCH 31.7 26.0 - 34.0 pg   MCHC 34.6 32.0 - 36.0 g/dL   RDW 13.2 11.5 - 14.5 %   Platelets 316 150 - 440 K/uL    Current Facility-Administered Medications  Medication Dose Route Frequency Provider Last Rate Last Dose  . acetaminophen (TYLENOL) tablet 1,000 mg  1,000 mg Oral Q6H Benjaman Kindler, MD   1,000 mg at 11/09/16 1719  . acetaminophen (TYLENOL) tablet 650 mg  650 mg Oral Q4H PRN Benjaman Kindler, MD      . bisacodyl (DULCOLAX) suppository 10 mg  10 mg Rectal Daily PRN Benjaman Kindler, MD      . coconut oil  1 application Topical PRN Benjaman Kindler, MD      . witch hazel-glycerin (TUCKS) pad 1 application  1 application  Topical PRN Benjaman Kindler, MD       And  . dibucaine (NUPERCAINAL) 1 % rectal ointment 1 application  1 application Rectal PRN Benjaman Kindler, MD      . diphenhydrAMINE (BENADRYL) injection 12.5 mg  12.5 mg Intravenous Q4H PRN Gunnar Bulla, MD       Or  . diphenhydrAMINE (BENADRYL) capsule 25 mg  25 mg Oral Q4H PRN Gunnar Bulla, MD      . diphenhydrAMINE (BENADRYL) capsule 25 mg  25 mg Oral Q6H PRN Benjaman Kindler, MD      . fentaNYL (SUBLIMAZE) injection 25 mcg  25 mcg Intravenous  Q5 min PRN Gunnar Bulla, MD      . Derrill Memo ON 11/10/2016] ferrous sulfate tablet 325 mg  325 mg Oral Q breakfast Ward, Chelsea C, MD      . gabapentin (NEURONTIN) capsule 900 mg  900 mg Oral QHS Benjaman Kindler, MD      . Influenza vac split quadrivalent PF (FLUARIX) injection 0.5 mL  0.5 mL Intramuscular Tomorrow-1000 Benjaman Kindler, MD      . ketorolac (TORADOL) injection 60 mg  60 mg Intramuscular Q8H PRN Schermerhorn, Gwen Her, MD   60 mg at 11/08/16 1401  . lactated ringers infusion   Intravenous Continuous Benjaman Kindler, MD      . LORazepam (ATIVAN) tablet 1 mg  1 mg Oral Q12H PRN Catheryn Bacon, CNM   1 mg at 11/09/16 0052  . measles, mumps and rubella vaccine (MMR) injection 0.5 mL  0.5 mL Subcutaneous Once Benjaman Kindler, MD      . menthol-cetylpyridinium (CEPACOL) lozenge 3 mg  1 lozenge Oral Q2H PRN Benjaman Kindler, MD      . meperidine (DEMEROL) injection 6.25 mg  6.25 mg Intravenous Q5 min PRN Gunnar Bulla, MD      . nalbuphine (NUBAIN) injection 5 mg  5 mg Intravenous Q4H PRN Gunnar Bulla, MD       Or  . nalbuphine (NUBAIN) injection 5 mg  5 mg Subcutaneous Q4H PRN Gunnar Bulla, MD      . naloxone [facility]) injection 0.4 mg  0.4 mg Intravenous PRN Gunnar Bulla, MD       And  . sodium chloride flush (NS) 0.9 % injection 3 mL  3 mL Intravenous PRN Gunnar Bulla, MD      . ondansetron [facility]) injection 4 mg  4 mg Intravenous Once PRN Gunnar Bulla, MD      . ondansetron  [facility]) injection 4 mg  4 mg Intravenous Q8H PRN Gunnar Bulla, MD      . oxyCODONE (Oxy IR/ROXICODONE) immediate release tablet 10 mg  10 mg Oral Q4H PRN Benjaman Kindler, MD   10 mg at 11/09/16 1733  . oxyCODONE (Oxy IR/ROXICODONE) immediate release tablet 5 mg  5 mg Oral Q4H PRN Benjaman Kindler, MD      . prenatal multivitamin tablet 1 tablet  1 tablet Oral Q1200 Benjaman Kindler, MD   1 tablet at 11/09/16 1118  . senna-docusate (Senokot-S) tablet 2 tablet  2 tablet Oral Q24H Benjaman Kindler, MD   2 tablet at 11/09/16 [phone]  . simethicone (MYLICON) chewable tablet 80 mg  80 mg Oral TID PC Benjaman Kindler, MD   80 mg at 11/09/16 1720  . simethicone (MYLICON) chewable tablet 80 mg  80 mg Oral Q24H Benjaman Kindler, MD      . simethicone [facility]) chewable tablet 80 mg  80 mg Oral PRN Benjaman Kindler, MD      . sodium phosphate (FLEET) 7-19 GM/118ML enema 1 enema  1 enema Rectal Daily PRN Benjaman Kindler, MD      . Tdap Durwin Reges) injection 0.5 mL  0.5 mL Intramuscular Once Benjaman Kindler, MD        Musculoskeletal: Strength & Muscle Tone: decreased Gait & Station: normal Patient leans: N/A  Psychiatric Specialty Exam: Physical Exam  Nursing note and vitals reviewed. Constitutional: She appears well-developed and well-nourished.  HENT:  Head: Normocephalic and atraumatic.  Eyes: Pupils are equal, round, and reactive to light. Conjunctivae are normal.  Neck: Normal range of motion.  Cardiovascular: Regular rhythm and normal heart sounds.  Respiratory: Effort normal. No respiratory distress.  GI: Soft.  Musculoskeletal: Normal range of motion.  Neurological: She is alert.  Skin: Skin is warm and dry.  Psychiatric: Judgment normal. Her affect is blunt. Her speech is delayed. She is slowed. Thought content is not paranoid. Cognition and memory are normal. She expresses no homicidal and no suicidal ideation.    Review of Systems  Constitutional: Negative.   HENT: Negative.    Eyes: Negative.   Respiratory: Negative.   Cardiovascular: Negative.   Gastrointestinal: Positive for abdominal pain.  Musculoskeletal: Negative.   Skin: Negative.   Neurological: Negative.   Psychiatric/Behavioral: Negative for depression, hallucinations, memory loss, substance abuse and suicidal ideas. The patient is not nervous/anxious and does not have insomnia.     Blood pressure (!) 97/49, pulse 73, temperature 98.7 F (37.1 C), temperature source Oral, resp. rate 16, height '4\' 11"'$  (1.499 m), weight 68 kg (150 lb), SpO2 99 %, unknown if currently breastfeeding.Body mass index is 30.3 kg/m.  General Appearance: Casual  Eye Contact:  Minimal  Speech:  Slow  Volume:  Decreased  Mood:  Irritable  Affect:  Congruent  Thought Process:  Coherent  Orientation:  Full (Time, Place, and Person)  Thought Content:  Logical  Suicidal Thoughts:  No  Homicidal Thoughts:  No  Memory:  Immediate;   Fair Recent;   Fair Remote;   Fair  Judgement:  Impaired  Insight:  Fair  Psychomotor Activity:  Decreased  Concentration:  Concentration: Fair  Recall:  [employer] of Knowledge:  Fair  Language:  Fair  Akathisia:  No  Handed:  Right  AIMS (if indicated):     Assets:  Housing Social Support  ADL's:  Intact  Cognition:  WNL  Sleep:        Treatment Plan Summary: Plan 41 year old woman who is confirmed to be legitimately prescribed Vyvanse, Adderall, Xanax and Suboxone outside the hospital. She says that she stopped her on Suboxone a couple days before coming into the hospital. This is actually logical given that she knew she was going to need treatment with narcotic pain medicine in the hospital. It would be inappropriate to restart the Suboxone while she was still taking narcotics for pain because of the risk of causing her to go into withdrawal. Patient does not need to restart this medicine now but can be managed by her regular physician as an outpatient. I don't think there is any  value or need to restarting her stimulant medicine. When necessary benzodiazepines can be supplied but otherwise patient does not need any prescriptions for new controlled substances at discharge. Neither does she need any further psychiatric referral as she currently sees Dr. Kasandra Knudsen.  Disposition: Patient does not meet criteria for psychiatric inpatient admission. Supportive therapy provided about ongoing stressors.  Alethia Berthold, MD 11/09/2016 8:36 PM

## 2016-11-09 NOTE — Care Management (Signed)
RNCM consult for "Patient not sure where she and her children will be living, has multiple social issues".  CSW is following.  RNCM signing off.  Please re consult if indicated.

## 2016-11-10 MED ORDER — IBUPROFEN 800 MG PO TABS: 800.0000 mg | ORAL_TABLET | Freq: Three times a day (TID) | ORAL | 0 refills | Status: AC | PRN

## 2016-11-10 MED ORDER — ONDANSETRON HCL 4 MG/2ML IJ SOLN: 4.0000 mg | Freq: Once | INTRAMUSCULAR | 0 refills | Status: AC | PRN

## 2016-11-10 MED ORDER — OXYCODONE HCL 5 MG PO TABS: 5.0000 mg | ORAL_TABLET | ORAL | 0 refills | Status: AC | PRN

## 2016-11-10 NOTE — Progress Notes (Signed)
Discharge order received from doctor. Flu vaccine and Tdap vaccine offered prior to discharge. Patient declined flu and tdap vaccines. Reviewed discharge instructions and prescriptions with patient and answered all questions. Incision cleaning kit given. Follow up appointment given. Patient verbalized understanding. Patient discharged home without infant (infant in SCN ) via wheelchair by nursing/auxillary.    Hilbert Bible, RN

## 2016-11-10 NOTE — Discharge Instructions (Signed)
Please call your doctor or return to the ER if you experience any chest pains, shortness of breath, dizziness, visual changes, fever greater than 101, any heavy bleeding (saturating more than 1 pad per hour), large clots, or foul smelling discharge, any worsening abdominal pain and cramping that is not controlled by pain medication, or any signs of postpartum depression. No tampons, enemas, douches, or sexual intercourse for 6 weeks. Also avoid tub baths, hot tubs, or swimming for 6 weeks.  ° ° °Check your incision daily for any signs of infection such as redness, warmth, swelling, increased pain, or pus/foul smelling drainage ° ° °Activity: do not lift over 10 lbs for 6 weeks  °No driving for 1-2 weeks  °Pelvic rest for 6 weeks  °

## 2016-12-24 LAB — MISCELLANEOUS TEST

## 2021-12-01 DIAGNOSIS — F902 Attention-deficit hyperactivity disorder, combined type: Secondary | ICD-10-CM | POA: Diagnosis not present
